# Patient Record
Sex: Female | Born: 1965 | Race: Black or African American | Hispanic: No | Marital: Married | Smoking: Never smoker
Health system: Southern US, Community
[De-identification: ages and names within clinical notes are randomized; demographics above are authoritative.]

## PROBLEM LIST (undated history)

## (undated) DIAGNOSIS — E119 Type 2 diabetes mellitus without complications: Secondary | ICD-10-CM

## (undated) DIAGNOSIS — F419 Anxiety disorder, unspecified: Secondary | ICD-10-CM

## (undated) DIAGNOSIS — F32A Depression, unspecified: Secondary | ICD-10-CM

## (undated) DIAGNOSIS — F329 Major depressive disorder, single episode, unspecified: Secondary | ICD-10-CM

## (undated) DIAGNOSIS — E785 Hyperlipidemia, unspecified: Secondary | ICD-10-CM

## (undated) DIAGNOSIS — I10 Essential (primary) hypertension: Secondary | ICD-10-CM

## (undated) DIAGNOSIS — E559 Vitamin D deficiency, unspecified: Secondary | ICD-10-CM

---

## 2005-04-03 ENCOUNTER — Emergency Department: Payer: Self-pay | Admitting: Unknown Physician Specialty

## 2005-08-13 ENCOUNTER — Ambulatory Visit: Payer: Self-pay

## 2006-09-30 ENCOUNTER — Ambulatory Visit: Payer: Self-pay | Admitting: Internal Medicine

## 2007-05-18 ENCOUNTER — Ambulatory Visit: Payer: Self-pay | Admitting: Internal Medicine

## 2007-05-25 ENCOUNTER — Ambulatory Visit: Payer: Self-pay | Admitting: Internal Medicine

## 2007-07-01 ENCOUNTER — Encounter: Admission: RE | Admit: 2007-07-01 | Discharge: 2007-07-01 | Payer: Self-pay | Admitting: *Deleted

## 2007-12-28 ENCOUNTER — Ambulatory Visit: Payer: Self-pay | Admitting: Internal Medicine

## 2008-02-01 ENCOUNTER — Ambulatory Visit: Payer: Self-pay | Admitting: Internal Medicine

## 2009-02-25 ENCOUNTER — Ambulatory Visit: Payer: Self-pay | Admitting: Internal Medicine

## 2010-10-14 ENCOUNTER — Ambulatory Visit: Payer: Self-pay | Admitting: Internal Medicine

## 2018-02-23 ENCOUNTER — Other Ambulatory Visit: Payer: Self-pay | Admitting: Internal Medicine

## 2018-02-23 ENCOUNTER — Ambulatory Visit
Admission: RE | Admit: 2018-02-23 | Discharge: 2018-02-23 | Disposition: A | Discharge status: Home / Self Care | Payer: 59 | Source: Ambulatory Visit | Attending: Internal Medicine | Admitting: Internal Medicine

## 2018-02-23 DIAGNOSIS — K5900 Constipation, unspecified: Secondary | ICD-10-CM | POA: Diagnosis not present

## 2018-02-23 DIAGNOSIS — R109 Unspecified abdominal pain: Secondary | ICD-10-CM | POA: Diagnosis present

## 2018-02-23 DIAGNOSIS — R160 Hepatomegaly, not elsewhere classified: Secondary | ICD-10-CM | POA: Diagnosis not present

## 2018-02-23 HISTORY — DX: Essential (primary) hypertension: I10

## 2018-02-23 HISTORY — DX: Type 2 diabetes mellitus without complications: E11.9

## 2018-02-23 MED ORDER — IOPAMIDOL (ISOVUE-300) INJECTION 61%
100.0000 mL | Freq: Once | INTRAVENOUS | Status: AC | PRN
  Administered 2018-02-23: 100 mL via INTRAVENOUS

## 2018-02-24 ENCOUNTER — Other Ambulatory Visit: Payer: Self-pay | Admitting: Internal Medicine

## 2018-02-24 DIAGNOSIS — K769 Liver disease, unspecified: Secondary | ICD-10-CM

## 2018-04-06 ENCOUNTER — Ambulatory Visit
Admission: RE | Admit: 2018-04-06 | Payer: Managed Care, Other (non HMO) | Source: Ambulatory Visit | Admitting: Internal Medicine

## 2018-04-06 ENCOUNTER — Encounter: Admission: RE | Payer: Self-pay | Source: Ambulatory Visit

## 2018-04-06 SURGERY — COLONOSCOPY WITH PROPOFOL
Anesthesia: General

## 2018-05-02 ENCOUNTER — Encounter: Payer: Self-pay | Admitting: *Deleted

## 2018-05-03 ENCOUNTER — Encounter: Payer: Self-pay | Admitting: Anesthesiology

## 2018-05-03 ENCOUNTER — Ambulatory Visit
Admission: RE | Admit: 2018-05-03 | Payer: Managed Care, Other (non HMO) | Source: Ambulatory Visit | Admitting: Internal Medicine

## 2018-05-03 ENCOUNTER — Encounter: Admission: RE | Payer: Self-pay | Source: Ambulatory Visit

## 2018-05-03 HISTORY — DX: Hyperlipidemia, unspecified: E78.5

## 2018-05-03 HISTORY — DX: Vitamin D deficiency, unspecified: E55.9

## 2018-05-03 HISTORY — DX: Major depressive disorder, single episode, unspecified: F32.9

## 2018-05-03 HISTORY — DX: Anxiety disorder, unspecified: F41.9

## 2018-05-03 HISTORY — DX: Depression, unspecified: F32.A

## 2018-05-03 SURGERY — COLONOSCOPY WITH PROPOFOL
Anesthesia: General

## 2018-05-03 MED ORDER — FENTANYL CITRATE (PF) 100 MCG/2ML IJ SOLN
INTRAMUSCULAR | Status: AC
  Filled 2018-05-03: qty 2

## 2018-05-03 MED ORDER — PROPOFOL 500 MG/50ML IV EMUL
INTRAVENOUS | Status: AC
  Filled 2018-05-03: qty 50

## 2018-05-03 MED ORDER — MIDAZOLAM HCL 2 MG/2ML IJ SOLN
INTRAMUSCULAR | Status: AC
  Filled 2018-05-03: qty 2

## 2018-08-24 ENCOUNTER — Ambulatory Visit: Admission: RE | Admit: 2018-08-24 | Payer: Managed Care, Other (non HMO) | Source: Ambulatory Visit

## 2018-09-06 ENCOUNTER — Encounter: Admission: RE | Payer: Self-pay | Source: Ambulatory Visit

## 2018-09-06 ENCOUNTER — Ambulatory Visit
Admission: RE | Admit: 2018-09-06 | Payer: Managed Care, Other (non HMO) | Source: Ambulatory Visit | Admitting: Internal Medicine

## 2018-09-06 SURGERY — COLONOSCOPY WITH PROPOFOL
Anesthesia: General

## 2018-12-02 ENCOUNTER — Emergency Department
Admission: EM | Admit: 2018-12-02 | Discharge: 2018-12-02 | Disposition: A | Discharge status: Home / Self Care | Payer: Managed Care, Other (non HMO) | Attending: Emergency Medicine | Admitting: Emergency Medicine

## 2018-12-02 ENCOUNTER — Other Ambulatory Visit: Payer: Self-pay

## 2018-12-02 DIAGNOSIS — I10 Essential (primary) hypertension: Secondary | ICD-10-CM | POA: Diagnosis not present

## 2018-12-02 DIAGNOSIS — E162 Hypoglycemia, unspecified: Secondary | ICD-10-CM

## 2018-12-02 DIAGNOSIS — Z79899 Other long term (current) drug therapy: Secondary | ICD-10-CM | POA: Insufficient documentation

## 2018-12-02 DIAGNOSIS — R61 Generalized hyperhidrosis: Secondary | ICD-10-CM | POA: Diagnosis present

## 2018-12-02 DIAGNOSIS — Z7982 Long term (current) use of aspirin: Secondary | ICD-10-CM | POA: Insufficient documentation

## 2018-12-02 DIAGNOSIS — E119 Type 2 diabetes mellitus without complications: Secondary | ICD-10-CM | POA: Diagnosis not present

## 2018-12-02 DIAGNOSIS — Z794 Long term (current) use of insulin: Secondary | ICD-10-CM | POA: Insufficient documentation

## 2018-12-02 LAB — CBC WITH DIFFERENTIAL/PLATELET
Abs Immature Granulocytes: 0.02 10*3/uL (ref 0.00–0.07)
Basophils Absolute: 0 10*3/uL (ref 0.0–0.1)
Basophils Relative: 0 %
EOS ABS: 0.3 10*3/uL (ref 0.0–0.5)
Eosinophils Relative: 3 %
HCT: 41.5 % (ref 36.0–46.0)
Hemoglobin: 13.4 g/dL (ref 12.0–15.0)
IMMATURE GRANULOCYTES: 0 %
Lymphocytes Relative: 51 %
Lymphs Abs: 5 10*3/uL — ABNORMAL HIGH (ref 0.7–4.0)
MCH: 26.4 pg (ref 26.0–34.0)
MCHC: 32.3 g/dL (ref 30.0–36.0)
MCV: 81.9 fL (ref 80.0–100.0)
Monocytes Absolute: 0.8 10*3/uL (ref 0.1–1.0)
Monocytes Relative: 8 %
NEUTROS PCT: 38 %
Neutro Abs: 3.8 10*3/uL (ref 1.7–7.7)
Platelets: 229 10*3/uL (ref 150–400)
RBC: 5.07 MIL/uL (ref 3.87–5.11)
RDW: 12.8 % (ref 11.5–15.5)
WBC: 9.9 10*3/uL (ref 4.0–10.5)
nRBC: 0 % (ref 0.0–0.2)

## 2018-12-02 LAB — COMPREHENSIVE METABOLIC PANEL
ALT: 21 U/L (ref 0–44)
AST: 30 U/L (ref 15–41)
Albumin: 3.8 g/dL (ref 3.5–5.0)
Alkaline Phosphatase: 103 U/L (ref 38–126)
Anion gap: 10 (ref 5–15)
BUN: 16 mg/dL (ref 6–20)
CO2: 25 mmol/L (ref 22–32)
Calcium: 9 mg/dL (ref 8.9–10.3)
Chloride: 108 mmol/L (ref 98–111)
Creatinine, Ser: 0.63 mg/dL (ref 0.44–1.00)
GFR calc Af Amer: >60 mL/min (ref >60)
GFR calc non Af Amer: >60 mL/min (ref >60)
Glucose, Bld: 34 mg/dL — CL (ref 70–99)
Potassium: 3.3 mmol/L — ABNORMAL LOW (ref 3.5–5.1)
Sodium: 143 mmol/L (ref 135–145)
Total Bilirubin: 0.7 mg/dL (ref 0.3–1.2)
Total Protein: 8.1 g/dL (ref 6.5–8.1)

## 2018-12-02 LAB — GLUCOSE, CAPILLARY
Glucose-Capillary: 18 mg/dL — CL (ref 70–99)
Glucose-Capillary: 171 mg/dL — ABNORMAL HIGH (ref 70–99)
Glucose-Capillary: 183 mg/dL — ABNORMAL HIGH (ref 70–99)
Glucose-Capillary: 192 mg/dL — ABNORMAL HIGH (ref 70–99)

## 2018-12-02 MED ORDER — DEXTROSE 50 % IV SOLN
INTRAVENOUS | Status: AC
  Filled 2018-12-02: qty 50

## 2018-12-02 MED ORDER — DEXTROSE 50 % IV SOLN
1.0000 | Freq: Once | INTRAVENOUS | Status: AC
  Administered 2018-12-02: 50 mL via INTRAVENOUS

## 2018-12-02 NOTE — Discharge Instructions (Addendum)
Continue to take your insulin as prescribed.  Be sure to check your sugar frequently over the next several days.  Return to the ER for new or worsening hypoglycemia, weakness or lightheadedness, vomiting, or any other new or worsening symptoms that concern you.

## 2018-12-02 NOTE — ED Triage Notes (Signed)
Patient's family reports patient with typical signs of low blood sugar (did not check at home) patient was sweaty and not wanting to talk.  Patient was given juice at home.

## 2018-12-02 NOTE — ED Notes (Signed)
Pt. Going home with husband. 

## 2018-12-02 NOTE — ED Provider Notes (Signed)
[facility] Emergency Department Provider Note ____________________________________________   First MD Initiated Contact with Patient 12/02/18 1922     (approximate)  I have reviewed the triage vital signs and the nursing notes.   HISTORY  Chief Complaint Hypoglycemia    HPI Alicia Acosta is a 53 y.o. female with PMH as noted below including diabetes on insulin who presents with apparent hypoglycemia, acute onset just prior to arrival, and characterized by being sweaty and having difficulty talking.  Patient did not improve with juice at home.  The patient states that she took her Humalog at approximately 6:30 PM thinking that she was about to eat, but then did not eat right away.  She also has not monitored her sugar at least several days.  After getting D50, the patient now denies any symptoms.  She denies any recent illness.   Past Medical History:  Diagnosis Date  . Anxiety   . Depression   . Diabetes mellitus without complication (HCC)   . Hyperlipidemia   . Hypertension   . Vitamin D deficiency     There are no active problems to display for this patient.   No past surgical history on file.  Prior to Admission medications   Medication Sig Start Date End Date Taking? Authorizing Provider  aspirin EC 81 MG tablet Take 81 mg by mouth daily.   Yes [provider]  clotrimazole-betamethasone (LOTRISONE) cream Apply 1 application topically 2 (two) times daily.   Yes [provider]  DULoxetine (CYMBALTA) 60 MG capsule Take 120 mg by mouth daily. 09/08/18  Yes [provider]  gabapentin (NEURONTIN) 100 MG capsule Take 100 mg by mouth 3 (three) times daily.   Yes [provider]  Glatiramer Acetate (COPAXONE) 40 MG/ML SOSY Inject 40 mg into the skin 3 (three) times a week.   Yes [provider]  insulin glargine (LANTUS) 100 UNIT/ML injection Inject 30 Units into the skin daily.    Yes [provider]  insulin lispro (HUMALOG) 100 UNIT/ML injection Inject 15 Units into the skin 3 (three) times daily before meals.    Yes [provider]  losartan-hydrochlorothiazide (HYZAAR) 100-12.5 MG tablet Take 1 tablet by mouth daily.   Yes [provider]  Multiple Vitamin (MULTIVITAMIN) tablet Take 1 tablet by mouth daily.   Yes [provider]  rosuvastatin (CRESTOR) 20 MG tablet Take 20 mg by mouth daily. 06/10/18 06/10/19 Yes [provider]  traZODone (DESYREL) 100 MG tablet Take 100 mg by mouth at bedtime.   Yes [provider]    Allergies Sulfa antibiotics  No family history on file.  Social History Social History   Tobacco Use  . Smoking status: Never Smoker  . Smokeless tobacco: Never Used  Substance Use Topics  . Alcohol use: Yes  . Drug use: Never    Review of Systems  Constitutional: No fever. Eyes: No redness. ENT: No sore throat. Cardiovascular: Denies chest pain. Respiratory: Denies shortness of breath. Gastrointestinal: No vomiting or diarrhea.  Genitourinary: Negative for dysuria.  Positive for frequency. Musculoskeletal: Negative for back pain. Skin: Negative for rash. Neurological: Negative for headache.   ____________________________________________   PHYSICAL EXAM:  VITAL SIGNS: ED Triage Vitals [12/02/18 1912]  Enc Vitals Group     BP (!) 164/74     Pulse Rate (!) 107     Resp 20     Temp      Temp src  SpO2 99 %     Weight 190 lb (86.2 kg)     Height 5\' 6"  (1.676 m)     Head Circumference      Peak Flow      Pain Score 0     Pain Loc      Pain Edu?      Excl. in GC?     Constitutional: Alert and oriented. Well appearing and in no acute distress. Eyes: Conjunctivae are normal.  Head: Atraumatic. Nose: No congestion/rhinnorhea. Mouth/Throat: Mucous membranes are moist.   Neck: Normal range of motion.  Cardiovascular: Normal rate, regular rhythm. Grossly normal heart sounds.   Good peripheral circulation. Respiratory: Normal respiratory effort.  No retractions. Lungs CTAB. Gastrointestinal: No distention.  Musculoskeletal: Extremities warm and well perfused.  Neurologic:  Normal speech and language. No gross focal neurologic deficits are appreciated.  Skin:  Skin is warm and dry. No rash noted. Psychiatric: Mood and affect are normal. Speech and behavior are normal.  ____________________________________________   LABS (all labs ordered are listed, but only abnormal results are displayed)  Labs Reviewed  GLUCOSE, CAPILLARY - Abnormal; Notable for the following components:      Result Value   Glucose-Capillary 18 (*)    All other components within normal limits  COMPREHENSIVE METABOLIC PANEL - Abnormal; Notable for the following components:   Potassium 3.3 (*)    Glucose, Bld 34 (*)    All other components within normal limits  CBC WITH DIFFERENTIAL/PLATELET - Abnormal; Notable for the following components:   Lymphs Abs 5.0 (*)    All other components within normal limits  GLUCOSE, CAPILLARY - Abnormal; Notable for the following components:   Glucose-Capillary 183 (*)    All other components within normal limits  GLUCOSE, CAPILLARY - Abnormal; Notable for the following components:   Glucose-Capillary 171 (*)    All other components within normal limits   ____________________________________________  EKG   ____________________________________________  RADIOLOGY    ____________________________________________   PROCEDURES  Procedure(s) performed: No  Procedures  Critical Care performed: No ____________________________________________   INITIAL IMPRESSION / ASSESSMENT AND PLAN / ED COURSE  Pertinent labs & imaging results that were available during my care of the patient were reviewed by me and considered in my medical decision making (see chart for details).  53 year old female with PMH as noted above including diabetes on Humalog  and Lantus presents with apparent hypoglycemia shortly after taking her Humalog around 6:30 PM and then not eating.  On arrival to the ED the patient was altered and diaphoretic.  Her glucose was noted to be 18 and she was given D50.  She now is back at her baseline mental status and is asymptomatic.  The remainder of the exam is unremarkable.  The vital signs are stable.  Presentation is consistent with hyperglycemia related to the short acting insulin.  We will observe her for several hours, repeat glucose and I anticipate discharge home if she remains stable.  ----------------------------------------- 8:28 PM on 12/02/2018 -----------------------------------------  The patient continues to remain alert with stable blood glucose.  She was able to eat and is tolerating p.o. without difficulty.  She very much would like to go home.  She has been in the ED for approximately 90 minutes at this time.  I advised that I ordinarily would prefer to watch her a little bit longer, however since she is going home with a family member and will be observed for the next few hours I think it  is reasonable to send her home now.  She is stable for discharge at this time.  Return precautions given, and she expressed understanding.  ____________________________________________   FINAL CLINICAL IMPRESSION(S) / ED DIAGNOSES  Final diagnoses:  Hypoglycemia      NEW MEDICATIONS STARTED DURING THIS VISIT:  New Prescriptions   No medications on file     Note:  This document was prepared using Dragon voice recognition software and may include unintentional dictation errors.    Dionne Bucy, MD 12/02/18 2029

## 2019-05-20 ENCOUNTER — Other Ambulatory Visit: Payer: Self-pay

## 2019-05-20 DIAGNOSIS — Z20822 Contact with and (suspected) exposure to covid-19: Secondary | ICD-10-CM

## 2019-05-21 LAB — NOVEL CORONAVIRUS, NAA: SARS-CoV-2, NAA: NOT DETECTED

## 2019-05-23 ENCOUNTER — Telehealth: Payer: Self-pay | Admitting: General Practice

## 2019-05-23 NOTE — Telephone Encounter (Signed)
Pt aware covid lab test negative, not detected °

## 2019-07-24 ENCOUNTER — Other Ambulatory Visit: Payer: Self-pay | Admitting: Infectious Diseases

## 2019-07-24 ENCOUNTER — Other Ambulatory Visit: Payer: Self-pay

## 2019-07-24 ENCOUNTER — Ambulatory Visit
Admission: RE | Admit: 2019-07-24 | Discharge: 2019-07-24 | Disposition: A | Discharge status: Home / Self Care | Payer: 59 | Source: Ambulatory Visit | Attending: Infectious Diseases | Admitting: Infectious Diseases

## 2019-07-24 DIAGNOSIS — M25511 Pain in right shoulder: Secondary | ICD-10-CM

## 2019-07-24 DIAGNOSIS — M7989 Other specified soft tissue disorders: Secondary | ICD-10-CM

## 2019-12-22 ENCOUNTER — Encounter: Payer: 59 | Attending: "Endocrinology | Admitting: Dietician

## 2019-12-22 ENCOUNTER — Other Ambulatory Visit: Payer: Self-pay

## 2019-12-22 DIAGNOSIS — Z713 Dietary counseling and surveillance: Secondary | ICD-10-CM | POA: Diagnosis present

## 2019-12-22 DIAGNOSIS — E1042 Type 1 diabetes mellitus with diabetic polyneuropathy: Secondary | ICD-10-CM | POA: Insufficient documentation

## 2019-12-22 NOTE — Patient Instructions (Signed)
   Plan to eat something every 4-5 hours during the day. Include some protein and some carb with each meal.   If you want to include orange juice, try [place] 50, and keep to 4-6 ounce portion.  If not hungry, try at least a small amount such as a few crackers with peanut butter or cheese  Try cool, fresh meals like salads, chicken or tuna salad with crackers, low fat cheese and crackers or fruit, small amount of pudding with graham crackers and peanut butter  Include a snack with some carb and protein before going to sleep especially if up more than 3 hours after dinner.   Remember that stress, pain, sleep (inadequate sleep) can all affect blood sugar.

## 2019-12-22 NOTE — Progress Notes (Signed)
Medical Nutrition Therapy: Visit start time: 3818  end time: 1500  Assessment:  Diagnosis: Type 1 diabetes Past medical history: MS, HTN, hyperlipidemia, sleep apnea Psychosocial issues/ stress concerns: high stress level/ depression due to health, pandemic. Under care of psychiatrist.  Preferred learning method:  . Hands-on   Current weight: 191.6lbs Height: 5'6" Medications, supplements: reconciled list in medical record. DM meds = Lantus 30 units daily, Humalog 15 units before meals  Progress and evaluation:   Patient reports fluctuating BGs with some low BGs and some highs. Drinks simply lemonade if BG is low. Recent episode of 46 in early am 3/18, drank juice and then was 139. She reports some high BGs after taking hypoglycemia treatment.   She was seen at this office several years ago for carb counting but has not been able to implement effectively.   She reports often lack of hunger and then doesn't eat or eats very little.Has been reducing intake of diet soda, limiting to 12oz qam and 12oz qpm.  High stress level might also be affecting BG control per patient including pandemic, helping mother and mother-in-law. Limiting shopping except for lysol spray.    She takes care of granddaughter with special needs Mon - Thursdays.  Physical activity: walking in spring, summer, fall, not in winter. Plans to resume soon.  Dietary Intake:  Usual eating pattern includes 1-3 meals and 0-1 snacks per day. Dining out frequency: 3-6 meals per week.  Breakfast: cooks most days usu bacon and eggs but often does not eat; occ oatmeal with fruit but does not enjoy it; 1 piece toast; today 1/2 pc bacon only Snack: none Lunch: often skips; sometimes 2pm -- quick meal -- leftovers; cajun [place] sandwich and pimento cheese; chicken wings; [employer] chicken tenders (ate 1), + 1 portion pudding; frozen meal ie chicken alfredo/ meat loaf with mac and cheese; crackers with peanut butter; chick fila kale  salad or grilled chicken sandwich Snack: none Supper: 7-8pm -- bbq chicken + rice + broccoli; crock pot chicken with rice/ noodles/ potatoes + mixed veg/ brocc, cabbage --variety; occ chick fila sandwich; usually baked foods; steak once a week with baked potato and cabbage occ just steak; chicken casserole with brocc and cheese; ground beef Snack: none Beverages: water, diet soda  Nutrition Care Education: Topics covered:  Basic nutrition: basic food groups, appropriate nutrient balance, appropriate meal and snack schedule, general nutrition guidelines    Weight control: estimated energy needs at minimum of 1200 kcal, provided guidance for 45% CHO, 25% pro, 30% fat using plate method and food lists. Advanced nutrition:  food label reading for portion size, carb content Diabetes: appropriate meal and snack schedule, appropriate carb intake and balance, role of fiber, protein, fat; basics of carb counting for mealtime insulin dosing; food and non-food causes of high and low BGs, importance of eating at regular intervals and consistent carb intake to help stabilize BGs   Nutritional Diagnosis:  [place]-2.2 Altered nutrition-related laboratory As related to Type 1 diabetes.  As evidenced by patient with recent HbA1C of 10.6%. [id number] Predicted suboptimal nutrient intake As related to poor appetite and skipped meals.  As evidenced by patient report of dietary history.  Intervention:   Instruction and discussion as noted above.  Poor appetite/ lack of hunger have led to infrequent and inconsistent eating pattern. Discussion today focused on improving balance and consistency of meals/ snacks.   Will work further on carb counting accuracy and possible pump therapy and next visit.  Provided written nutrition  goals with input from patient.  Education Materials given:  . Plate Planner with food lists, sample meal pattern . Sample menus . Snacking handout . Goals/ instructions   Learner/ who was  taught:  . Patient    Level of understanding: Marland Kitchen Verbalizes/ demonstrates competency   Demonstrated degree of understanding via:   Teach back Learning barriers: . None  Willingness to learn/ readiness for change: . Eager, change in progress   Monitoring and Evaluation:  Dietary intake, exercise, BG control, and body weight      follow up: 01/26/20 at 11:00am

## 2020-01-26 ENCOUNTER — Ambulatory Visit: Payer: 59 | Admitting: Dietician

## 2020-02-09 ENCOUNTER — Ambulatory Visit: Payer: 59 | Admitting: Dietician

## 2020-03-15 ENCOUNTER — Other Ambulatory Visit: Payer: Self-pay | Admitting: Family Medicine

## 2020-03-15 ENCOUNTER — Other Ambulatory Visit: Payer: Self-pay

## 2020-03-15 ENCOUNTER — Ambulatory Visit
Admission: RE | Admit: 2020-03-15 | Discharge: 2020-03-15 | Disposition: A | Discharge status: Home / Self Care | Payer: 59 | Source: Ambulatory Visit | Attending: Family Medicine | Admitting: Family Medicine

## 2020-03-15 DIAGNOSIS — M79672 Pain in left foot: Secondary | ICD-10-CM

## 2020-03-15 DIAGNOSIS — M7989 Other specified soft tissue disorders: Secondary | ICD-10-CM | POA: Diagnosis present

## 2020-03-20 ENCOUNTER — Encounter: Payer: Self-pay | Admitting: Dietician

## 2020-03-20 NOTE — Progress Notes (Signed)
Have not heard back from patient to reschedule her cancelled appointment from 02/09/20, which was rescheduled from 01/26/20. Sent notification to referring provider.

## 2020-04-09 ENCOUNTER — Ambulatory Visit: Payer: 59

## 2020-04-09 ENCOUNTER — Other Ambulatory Visit (HOSPITAL_COMMUNITY): Payer: Self-pay | Admitting: Physician Assistant

## 2020-04-09 ENCOUNTER — Other Ambulatory Visit: Payer: Self-pay | Admitting: Physician Assistant

## 2020-04-09 ENCOUNTER — Ambulatory Visit
Admission: RE | Admit: 2020-04-09 | Discharge: 2020-04-09 | Disposition: A | Discharge status: Home / Self Care | Payer: 59 | Source: Ambulatory Visit | Attending: Physician Assistant | Admitting: Physician Assistant

## 2020-04-09 ENCOUNTER — Other Ambulatory Visit: Payer: Self-pay

## 2020-04-09 DIAGNOSIS — R1012 Left upper quadrant pain: Secondary | ICD-10-CM | POA: Insufficient documentation

## 2020-07-09 ENCOUNTER — Encounter: Payer: Self-pay | Admitting: Physical Therapy

## 2020-07-09 ENCOUNTER — Other Ambulatory Visit: Payer: Self-pay

## 2020-07-09 ENCOUNTER — Ambulatory Visit: Payer: 59 | Attending: Neurology | Admitting: Physical Therapy

## 2020-07-09 DIAGNOSIS — R296 Repeated falls: Secondary | ICD-10-CM | POA: Diagnosis present

## 2020-07-09 DIAGNOSIS — R2689 Other abnormalities of gait and mobility: Secondary | ICD-10-CM | POA: Insufficient documentation

## 2020-07-09 DIAGNOSIS — M6281 Muscle weakness (generalized): Secondary | ICD-10-CM | POA: Insufficient documentation

## 2020-07-09 NOTE — Therapy (Signed)
[facility] PHYSICAL AND SPORTS MEDICINE [address], [place], [address] Phone: [phone]   Fax:  [phone]  Physical Therapy Evaluation  Patient Details  Name: Alicia Acosta MRN: [id number] Date of Birth: Apr 22, 1966 No data recorded  Encounter Date: 07/09/2020   PT End of Session - 07/09/20 1138    Visit Number 1    Number of Visits 17    Date for PT Re-Evaluation 09/06/20    PT Start Time 1030    PT Stop Time 1130    PT Time Calculation (min) 60 min    Equipment Utilized During Treatment Gait belt    Activity Tolerance Patient tolerated treatment well    Behavior During Therapy [facility] for tasks assessed/performed           Past Medical History:  Diagnosis Date  . Anxiety   . Depression   . Diabetes mellitus without complication (HCC)   . Hyperlipidemia   . Hypertension   . Vitamin D deficiency     History reviewed. No pertinent surgical history.  There were no vitals filed for this visit.    Subjective Assessment - 07/09/20 1039    Pertinent History Patient is a 54 year old female presenting with weakness, and balance deficits related to MS diagnosis. 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 Her last flare up was Jan 2021, and her balance and fatigue has been worse since causing her to seek out PT. Endorses multiple falls in the past 6 months (at least 5), but does report 3 of them were during a medication transition. Reports she has fallen by "missing a step" going down steps, reaching for water hose on the gross, and mostly falling when changing surfaces. After her last fall she suffered a fracture in her R 3rd toe, without pain. She is a diabetic, and reports she has full sensation in hands and feet, but that they do tingle often. Reports she has had increased general fatigue following Jan 2021 flare up d/t  decreased sleep health (reports she stays up until 3am), and activity induced fatigue, tiring fast with going up flight of stairs, walking > . Patient is on disability currently, but enjoys traveling with her friends/family (has aspirations to go to [place] in March 2022), she does walk around her neighborhood 3x/week for exercise (about ). 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 Pt denies N/V, B&B changes, unexplained weight fluctuation, saddle paresthesia, fever, night sweats, or unrelenting night pain at this time.    Limitations House hold activities;Standing;Walking;Lifting    How long can you sit comfortably? unlimited    How long can you stand comfortably?    How long can you walk comfortably?    Diagnostic tests MRI 05/18/20 Findings compatible with demyelinating disease within the brain. There  is a new 7 mm right periventricular lesion exhibiting mild diffusion restriction suggesting active demyelination. No abnormal T2 hyperintense lesions of the cervical cord.    Patient Stated Goals Increase endurance to usher at church, be able to dance, and increase walking distance.    Currently in Pain? No/denies                 OBJECTIVE  MUSCULOSKELETAL: Tremor: Absent Bulk: Normal Tone:  Normal, no clonus  Posture Lower crossed bilat ankle eversion with mild bilat knee valgus in standing  Gait No gross abnormalities in gait noted  Strength R/L 5/5 Hip flexion 5/5 Hip external rotation 5/5 Hip internal rotation 3-/3- Hip extension  3+/4- Hip abduction 4/4 Hip adduction 5/5 Knee extension 4/4 Knee flexion 3+/3+ Ankle Dorsiflexion Unable to complete d/t toe fx/2 raises Ankle Plantarflexion  NEUROLOGICAL:  Mental Status Patient is oriented to person, place and time.  Recent memory is intact.  Remote  memory is intact.  Attention span and concentration are intact.  Expressive speech is intact.  Patient's fund of knowledge is within normal limits for educational level.  Cranial Nerves Visual acuity and visual fields are intact  Extraocular muscles are intact  Facial sensation is intact bilaterally  Facial strength is intact bilaterally  Hearing is normal as tested by gross conversation Palate elevates midline, normal phonation  Shoulder shrug strength is intact  Tongue protrudes midline   Sensation Grossly intact to light touch bilateral UEs/LEs as determined by testing dermatomes C2-T2/L2-S2 respectively Proprioception and hot/cold testing deferred on this date  Reflexes R/L /2+ Knee Jerk (L3/4) deminished Ankle Jerk (S1/2)  Coordination/Cerebellar Finger to Nose: WNL Heel to Shin: WNL Rapid alternating movements: WNL Finger Opposition: WNL Pronator Drift: Negative   FUNCTIONAL OUTCOME MEASURES   Results Comments  DGI 18/24   FGA 20/30   SLS RLE: 2sec LLE: 2.5sec   5TSTS 26seconds   6 Minute Walk Test deferred   10 Meter Gait Speed Self-selected: s = 0.6m/s; Fastest: s = 1.20m/s Below normative values for full community ambulation    POSTURAL CONTROL TESTS   Modified Clinical Test of Sensory Interaction for Balance    (CTSIB):  CONDITION TIME STRATEGY SWAY  Eyes open, firm surface 30 seconds ankle none  Eyes closed, firm surface 30 seconds ankle minimal  Eyes open, foam surface 30 seconds ankle minimal  Eyes closed, foam surface 30 seconds ankle minimal    Ther-Ex PT reviewed the following HEP with patient with patient able to demonstrate a set of the following with min cuing for correction needed. PT educated patient on parameters of therex (how/when to inc/decrease intensity, frequency, rep/set range, stretch hold time, and purpose of therex) with verbalized understanding.  Access Code: [email or web address] Supine Bridge - 1 x daily - 7 x weekly - 3 sets - 5-10  reps Sit to Stand without Arm Support - 1 x daily - 7 x weekly - 3 sets - 5-10 reps Standing Single Leg Stance with Counter Support - 1 x daily - 7 x weekly - 3 sets - 15-30sec hold                        Objective measurements completed on examination: See above findings.               PT Education - 07/09/20 1100    Education Details Patient was educated on diagnosis, anatomy and pathology involved, prognosis, role of PT, and was given an HEP, demonstrating exercise with proper form following verbal and tactile cues, and was given a paper hand out to continue exercise at home. Pt was educated on and agreed to plan of care.    Person(s) Educated Patient    Methods Explanation;Demonstration;Tactile cues;Verbal cues    Comprehension Verbalized understanding;Returned demonstration;Verbal cues required;Tactile cues required            PT Short Term Goals - 07/09/20 1143  PT SHORT TERM GOAL #1   Title Pt will be independent with HEP in order to improve strength and balance in order to decrease fall risk and improve function at home and work.    Baseline 07/09/20 HEP given    Time 4    Period Weeks    Status New      PT SHORT TERM GOAL #2   Title Pt will decrease 5TSTS by at least 3 seconds in order to demonstrate clinically significant improvement in LE strength.    Baseline 07/09/20 26sec             PT Long Term Goals - 07/09/20 1241      PT LONG TERM GOAL #1   Title Patient will increase FOTO score to 62 to demonstrate predicted increase in functional mobility to complete ADLs    Baseline 07/09/20 57    Time 8    Period Weeks    Status New      PT LONG TERM GOAL #2   Title Pt will decrease 5TSTS by at least 10seconds in order to demonstrate normative BLE strength    Baseline 07/09/20 26sec    Time 8    Period Weeks    Status New      PT LONG TERM GOAL #3   Title Patient will demonstrate an increase in FGA score of at least 4 points to  demonstrate clinically significant increase in dynamic balance    Baseline 07/09/20 20/30    Time 8    Period Weeks    Status New                  Plan - 07/09/20 1258    Clinical Impression Statement Pt is a 54 year old female presenting with balance deficits/frequent falls and increased fatigue following MS flare up Jan 2021 of relapsing remittenting MS. Impairments in decreased endurance, decreased LE and core strength, pain, decreased sensation, and decreased dynamic balance. Activity impairments in walking >9115mins, stair ambulation, yard work, and lifting; inhibiting full participation in ADLs and community ambulation. Pt will benefit from skilled PT to address impairments to return to optimal PLOF    Personal Factors and Comorbidities Comorbidity 1;Comorbidity 2;Comorbidity 3+;Fitness;Past/Current Experience    Comorbidities HTN, DM, MS    Examination-Activity Limitations Squat;Lift;Stand;Locomotion Level    Examination-Participation Restrictions Psychiatric nurseCommunity Activity;Cleaning;Laundry;Yard Work    Conservation officer, historic buildingstability/Clinical Decision Making Evolving/Moderate complexity    Clinical Decision Making Moderate    Rehab Potential Good    PT Frequency 2x / week    PT Duration 8 weeks    PT Treatment/Interventions ADLs/Self Care Home Management;Cryotherapy;Moist Heat;Electrical Stimulation;DME Instruction;Traction;Ultrasound;Stair training;Gait training;Therapeutic activities;Therapeutic exercise;Neuromuscular re-education;Passive range of motion;Dry needling;Manual techniques;Iontophoresis 4mg /ml Dexamethasone;Functional mobility training;Balance training;Patient/family education;Spinal Manipulations;Joint Manipulations;Energy conservation    PT Next Visit Plan 6MWT, HEP review    PT Home Exercise Plan bridge, STS, SLS    Consulted and Agree with Plan of Care Patient           Patient will benefit from skilled therapeutic intervention in order to improve the following deficits and impairments:   Abnormal gait, Decreased balance, Decreased endurance, Decreased mobility, Difficulty walking, Decreased activity tolerance, Decreased coordination, Decreased strength, Increased fascial restricitons, Impaired flexibility, Impaired UE functional use, Postural dysfunction, Pain, Decreased range of motion, Improper body mechanics, Impaired sensation  Visit Diagnosis: Muscle weakness (generalized)  Other abnormalities of gait and mobility  Repeated falls     Problem List There are no problems to display for this patient.  Hilda Lias DPT Hilda Lias 07/09/2020, 2:55 PM  [facility] PHYSICAL AND SPORTS MEDICINE [address], [place], [address] Phone: [phone]   Fax:  [phone]  Name: Alicia Acosta MRN: [id number] Date of Birth: 1965-12-15

## 2020-07-11 ENCOUNTER — Encounter: Payer: 59 | Admitting: Physical Therapy

## 2020-07-12 ENCOUNTER — Ambulatory Visit: Payer: 59 | Admitting: Physical Therapy

## 2020-07-16 ENCOUNTER — Other Ambulatory Visit: Payer: Self-pay

## 2020-07-16 ENCOUNTER — Ambulatory Visit: Payer: 59 | Admitting: Physical Therapy

## 2020-07-16 ENCOUNTER — Encounter: Payer: Self-pay | Admitting: Physical Therapy

## 2020-07-16 DIAGNOSIS — M6281 Muscle weakness (generalized): Secondary | ICD-10-CM

## 2020-07-16 DIAGNOSIS — R2689 Other abnormalities of gait and mobility: Secondary | ICD-10-CM

## 2020-07-16 DIAGNOSIS — R296 Repeated falls: Secondary | ICD-10-CM

## 2020-07-16 NOTE — Therapy (Signed)
[place] [facility] PH[facility] AND [facility] MEDICINE [address] [place], [address] Phone: [phone]   Fax:  [phone]  Physical Therapy Treatment  Patient Details  Name: Alicia Acosta MRN: [id number] Date of Birth: [birth year] No data recorded  Encounter Date: 07/16/2020   PT End of Session - 07/16/20 1400    Visit Number 2    Number of Visits 17    Date for PT Re-Evaluation 09/06/20    PT Start Time 0155    PT Stop Time 0233    PT Time Calculation (min) 38 min    Activity Tolerance Patient tolerated treatment well    Behavior During Therapy [facility] for tasks assessed/performed           Past Medical History:  Diagnosis Date  . Anxiety   . Depression   . Diabetes mellitus without complication (HCC)   . Hyperlipidemia   . Hypertension   . Vitamin D deficiency     History reviewed. No pertinent surgical history.  There were no vitals filed for this visit.   Gait Training  1221ft with review of mechanics and encouragement throughout, education on energy conservation with verbalized understanding. Patient reports fatigue followig  at start: 1.29m/s  At final lap: 0.7m/s    Ther-Ex Bridge 2x12 initially patient reports she feels this in her low back, following glute set x10 is able to complete with glute contraction  STS from elevated surface 2x 12 with cuing for light tap and full hip ext with stand with good carry over Sidestepping 51ft 3x R and L with min cuing for foot clearance and large steps with good carry over Alt lateral lunges 2x 12 with good carry over of demo and cuing for technique                       PT Education - 07/16/20 1400    Education Details HEP review, therex form/technique    Person(s) Educated Patient    Methods Explanation;Demonstration;Verbal cues    Comprehension Verbalized understanding;Returned demonstration;Verbal cues required            PT Short Term  Goals - 07/09/20 1143      PT SHORT TERM GOAL #1   Title Pt will be independent with HEP in order to improve strength and balance in order to decrease fall risk and improve function at home and work.    Baseline 07/09/20 HEP given    Time 4    Period Weeks    Status New      PT SHORT TERM GOAL #2   Title Pt will decrease 5TSTS by at least 3 seconds in order to demonstrate clinically significant improvement in LE strength.    Baseline 07/09/20 26sec             PT Long Term Goals - 07/16/20 1436      PT LONG TERM GOAL #1   Title Patient will increase FOTO score to 62 to demonstrate predicted increase in functional mobility to complete ADLs    Baseline 07/09/20 57    Time 8    Period Weeks    Status New      PT LONG TERM GOAL #2   Title Pt will decrease 5TSTS by at least 10seconds in order to demonstrate normative BLE strength    Baseline 07/09/20 26sec    Time 8    Period Weeks    Status New  PT LONG TERM GOAL #3   Title Patient will demonstrate an increase in FGA score of at least 4 points to demonstrate clinically significant increase in dynamic balance    Baseline 07/09/20 20/30    Time 8    Period Weeks    Status New      PT LONG TERM GOAL #4   Title Pt will decrease walk speed differential from first 37m and last 48m of to at least 0.44m/s to demonstrate increased muscular and cardiovascular endurance over community distance    Baseline 07/16/20 0.6m/s (start: 1.2m/s; end 0.30m/s)    Time 8    Period Weeks    Status New                 Plan - 07/16/20 1409    Clinical Impression Statement PT completed walk test, where patient is able to demonstrate good community distance, but decreased endurance over as evidence in decrease in gait speed by 0.40m/s from start to end of test. Patient verbalizes understanding of findings and need for endurance based therex and increased walking program. Patient is able to comply with all multimodal cuing  for proper technique of therex with good motivation and no increased pain throughout session. PT will continue progression as able.    Personal Factors and Comorbidities Comorbidity 1;Comorbidity 2;Comorbidity 3+;Fitness;Past/Current Experience    Comorbidities HTN, DM, MS    Examination-Activity Limitations Squat;Lift;Stand;Locomotion Level    Examination-Participation Restrictions Psychiatric nurse;Yard Work    Conservation officer, historic buildings Evolving/Moderate complexity    Clinical Decision Making Moderate    Rehab Potential Good    PT Frequency 2x / week    PT Duration 8 weeks    PT Treatment/Interventions ADLs/Self Care Home Management;Cryotherapy;Moist Heat;Electrical Stimulation;DME Instruction;Traction;Ultrasound;Stair training;Gait training;Therapeutic activities;Therapeutic exercise;Neuromuscular re-education;Passive range of motion;Dry needling;Manual techniques;Iontophoresis 4mg /ml Dexamethasone;Functional mobility training;Balance training;Patient/family education;Spinal Manipulations;Joint Manipulations;Energy conservation    PT Next Visit Plan , HEP review    PT Home Exercise Plan bridge, STS, SLS           Patient will benefit from skilled therapeutic intervention in order to improve the following deficits and impairments:  Abnormal gait, Decreased balance, Decreased endurance, Decreased mobility, Difficulty walking, Decreased activity tolerance, Decreased coordination, Decreased strength, Increased fascial restricitons, Impaired flexibility, Impaired UE functional use, Postural dysfunction, Pain, Decreased range of motion, Improper body mechanics, Impaired sensation  Visit Diagnosis: Muscle weakness (generalized)  Other abnormalities of gait and mobility  Repeated falls     Problem List There are no problems to display for this patient.  DPT Hilda Lias 07/16/2020, 2:38 PM  [place] [facility] PHYSICAL AND SPORTS MEDICINE [address], [place] Phone: [phone]   Fax:  [phone]  Name: Alicia Acosta MRN: Ezequiel Kayser Date of Birth: 03-03-1966

## 2020-07-18 ENCOUNTER — Ambulatory Visit: Payer: 59 | Admitting: Physical Therapy

## 2020-07-23 ENCOUNTER — Ambulatory Visit: Payer: 59 | Admitting: Physical Therapy

## 2020-07-23 ENCOUNTER — Other Ambulatory Visit: Payer: Self-pay

## 2020-07-23 ENCOUNTER — Encounter: Payer: Self-pay | Admitting: Physical Therapy

## 2020-07-23 DIAGNOSIS — M6281 Muscle weakness (generalized): Secondary | ICD-10-CM | POA: Diagnosis not present

## 2020-07-23 DIAGNOSIS — R2689 Other abnormalities of gait and mobility: Secondary | ICD-10-CM

## 2020-07-23 DIAGNOSIS — R296 Repeated falls: Secondary | ICD-10-CM

## 2020-07-23 NOTE — Therapy (Signed)
[place] [facility] [address], [place], [address] Phone: [phone]   Fax:  [phone]  Physical Therapy Treatment  Patient Details  Name: Alicia Acosta MRN: [id number] Date of Birth: 05-03-1966 No data recorded  Encounter Date: 07/23/2020   PT End of Session - 07/23/20 1128    Visit Number 3    Number of Visits 17    Date for PT Re-Evaluation 09/06/20    PT Start Time 1119    PT Stop Time 1200    PT Time Calculation (min) 41 min    Activity Tolerance Patient tolerated treatment well    Behavior During Therapy [facility] for tasks assessed/performed           Past Medical History:  Diagnosis Date  . Anxiety   . Depression   . Diabetes mellitus without complication (HCC)   . Hyperlipidemia   . Hypertension   . Vitamin D deficiency     History reviewed. No pertinent surgical history.  There were no vitals filed for this visit.   Subjective Assessment - 07/23/20 1125    Subjective Patient was starting medication last week that made her sleepy and unable to attend PT. Has been compliant with HEP, and walked with a neighbor over the weekend.    Pertinent History Patient is a 54 year old female presenting with weakness, and balance deficits related to MS diagnosis. 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 Her last flare up was Jan 2021, and her balance and fatigue has been worse since causing her to seek out PT. Endorses multiple falls in the past 6 months (at least 5), but does report 3 of them were during a medication transition. Reports she has fallen by "missing a step" going down steps, reaching for water hose on the gross, and mostly falling when changing surfaces. After her last fall she suffered a fracture in her R 3rd toe, without pain. She is a diabetic, and reports she has full sensation in hands  and feet, but that they do tingle often. Reports she has had increased general fatigue following Jan 2021 flare up d/t decreased sleep health (reports she stays up until 3am), and activity induced fatigue, tiring fast with going up flight of stairs, walking > . Patient is on disability currently, but enjoys traveling with her friends/family (has aspirations to go to [place] in March 2022), she does walk around her neighborhood 3x/week for exercise (about ). 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 Pt denies N/V, B&B changes, unexplained weight fluctuation, saddle paresthesia, fever, night sweats, or unrelenting night pain at this time.    Limitations House hold activities;Standing;Walking;Lifting    How long can you sit comfortably? unlimited    How long can you stand comfortably?    How long can you walk comfortably?    Diagnostic tests MRI 05/18/20 Findings compatible with demyelinating disease within the brain. There  is a new 7 mm right periventricular lesion exhibiting mild diffusion restriction suggesting active demyelination. No abnormal T2 hyperintense lesions of the cervical cord.    Patient Stated Goals Increase endurance to usher at church, be able to dance, and increase walking distance.  Gait Training  1.  on 5% incline with cuing for increased stride and foot clearance with good carry over. Decreased DF endurance by end of ambulation Education on energy conservation of taking larger steps to take "less steps" with good understanding   Ther-Ex Seated bilat heel raise x20 with good carry over of demo Duck walks 4x 2ft with cuing to maintain DF with decent carry over Heel taps to 6in cone on 6in step x12; from 2.5in foam x12 with better carry over without lateral whip compensation on LLE L hip flex MMT  retested from max flex position 4-/5; R hip flex 5/5 Leg press LLE 20# 3x 10 with difficulty, attempted RLE much easier for patient  Alt lateral lunges over 6in hurdles 2x 12 with min cuing for technique with good carry over              PT Education - 07/23/20 1126    Education Details therex form/technique, gait training    Person(s) Educated Patient    Methods Explanation;Demonstration;Verbal cues;Tactile cues    Comprehension Verbalized understanding;Returned demonstration;Verbal cues required;Tactile cues required            PT Short Term Goals - 07/09/20 1143      PT SHORT TERM GOAL #1   Title Pt will be independent with HEP in order to improve strength and balance in order to decrease fall risk and improve function at home and work.    Baseline 07/09/20 HEP given    Time 4    Period Weeks    Status New      PT SHORT TERM GOAL #2   Title Pt will decrease 5TSTS by at least 3 seconds in order to demonstrate clinically significant improvement in LE strength.    Baseline 07/09/20 26sec             PT Long Term Goals - 07/16/20 1436      PT LONG TERM GOAL #1   Title Patient will increase FOTO score to 62 to demonstrate predicted increase in functional mobility to complete ADLs    Baseline 07/09/20 57    Time 8    Period Weeks    Status New      PT LONG TERM GOAL #2   Title Pt will decrease 5TSTS by at least 10seconds in order to demonstrate normative BLE strength    Baseline 07/09/20 26sec    Time 8    Period Weeks    Status New      PT LONG TERM GOAL #3   Title Patient will demonstrate an increase in FGA score of at least 4 points to demonstrate clinically significant increase in dynamic balance    Baseline 07/09/20 20/30    Time 8    Period Weeks    Status New      PT LONG TERM GOAL #4   Title Pt will decrease walk speed differential from first 58m and last 46m of to at least 0.98m/s to demonstrate increased muscular and cardiovascular  endurance over community distance    Baseline 07/16/20 0.34m/s (start: 1.57m/s; end 0.55m/s)    Time 8    Period Weeks    Status New                 Plan - 07/23/20 1153    Clinical Impression Statement PT continued therex progression and gait training for increased efficiency of gait, LE strength, and activity tolerance with success. Patinet is able to comply with  all cuing for gait mechanics and therex form/technique, with good motivation and understanding of energy conservation recommendations. PT will continue progression as able.    Personal Factors and Comorbidities Comorbidity 1;Comorbidity 2;Comorbidity 3+;Fitness;Past/Current Experience    Comorbidities HTN, DM, MS    Examination-Activity Limitations Squat;Lift;Stand;Locomotion Level    Examination-Participation Restrictions Psychiatric nurse;Yard Work    Conservation officer, historic buildings Evolving/Moderate complexity    Clinical Decision Making Moderate    Rehab Potential Good    PT Frequency 2x / week    PT Duration 8 weeks    PT Treatment/Interventions ADLs/Self Care Home Management;Cryotherapy;Moist Heat;Electrical Stimulation;DME Instruction;Traction;Ultrasound;Stair training;Gait training;Therapeutic activities;Therapeutic exercise;Neuromuscular re-education;Passive range of motion;Dry needling;Manual techniques;Iontophoresis 4mg /ml Dexamethasone;Functional mobility training;Balance training;Patient/family education;Spinal Manipulations;Joint Manipulations;Energy conservation    PT Next Visit Plan , HEP review    PT Home Exercise Plan bridge, STS, SLS    Consulted and Agree with Plan of Care Patient           Patient will benefit from skilled therapeutic intervention in order to improve the following deficits and impairments:  Abnormal gait, Decreased balance, Decreased endurance, Decreased mobility, Difficulty walking, Decreased activity tolerance, Decreased coordination, Decreased strength,  Increased fascial restricitons, Impaired flexibility, Impaired UE functional use, Postural dysfunction, Pain, Decreased range of motion, Improper body mechanics, Impaired sensation  Visit Diagnosis: Muscle weakness (generalized)  Other abnormalities of gait and mobility  Repeated falls     Problem List There are no problems to display for this patient.  DPT Hilda Lias 07/23/2020, 11:57 AM  [place] [facility] PHYSICAL AND SPORTS MEDICINE [address], [address], [place] Phone: [phone]   Fax:  [phone]  Name: Alicia Acosta MRN: Ezequiel Kayser Date of Birth: Sep 27, 1966

## 2020-07-25 ENCOUNTER — Other Ambulatory Visit: Payer: Self-pay

## 2020-07-25 ENCOUNTER — Ambulatory Visit: Payer: 59 | Admitting: Physical Therapy

## 2020-07-25 DIAGNOSIS — R2689 Other abnormalities of gait and mobility: Secondary | ICD-10-CM

## 2020-07-25 DIAGNOSIS — M6281 Muscle weakness (generalized): Secondary | ICD-10-CM | POA: Diagnosis not present

## 2020-07-25 DIAGNOSIS — R296 Repeated falls: Secondary | ICD-10-CM

## 2020-07-25 NOTE — Therapy (Signed)
[place] [facility] [address] [place], [address] Phone: [phone]   Fax:  [phone]  Physical Therapy Treatment  Patient Details  Name: Alicia Acosta MRN: [id number] Date of Birth: [birth year] No data recorded  Encounter Date: 07/25/2020   PT End of Session - 07/25/20 1126    Visit Number 4    Number of Visits 17    Date for PT Re-Evaluation 09/06/20    PT Start Time 1120    PT Stop Time 1200    PT Time Calculation (min) 40 min    Equipment Utilized During Treatment Gait belt    Activity Tolerance Patient tolerated treatment well    Behavior During Therapy [facility] for tasks assessed/performed           Past Medical History:  Diagnosis Date  . Anxiety   . Depression   . Diabetes mellitus without complication (HCC)   . Hyperlipidemia   . Hypertension   . Vitamin D deficiency     No past surgical history on file.  There were no vitals filed for this visit.   Subjective Assessment - 07/25/20 1123    Subjective Patient reports she was a little tired following last session, but felt "pretty good". She is still trying to walk more, completing HEP.    Pertinent History Patient is a 54 year old female presenting with weakness, and balance deficits related to MS diagnosis. 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 Her last flare up was Jan 2021, and her balance and fatigue has been worse since causing her to seek out PT. Endorses multiple falls in the past 6 months (at least 5), but does report 3 of them were during a medication transition. Reports she has fallen by "missing a step" going down steps, reaching for water hose on the gross, and mostly falling when changing surfaces. After her last fall she suffered a fracture in her R 3rd toe, without pain. She is a diabetic, and reports she has full sensation  in hands and feet, but that they do tingle often. Reports she has had increased general fatigue following Jan 2021 flare up d/t decreased sleep health (reports she stays up until 3am), and activity induced fatigue, tiring fast with going up flight of stairs, walking > . Patient is on disability currently, but enjoys traveling with her friends/family (has aspirations to go to [place] in March 2022), she does walk around her neighborhood 3x/week for exercise (about ). 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 Pt denies N/V, B&B changes, unexplained weight fluctuation, saddle paresthesia, fever, night sweats, or unrelenting night pain at this time.    Limitations House hold activities;Standing;Walking;Lifting    How long can you sit comfortably? unlimited    How long can you stand comfortably?    How long can you walk comfortably?    Diagnostic tests MRI 05/18/20 Findings compatible with demyelinating disease within the brain. There  is a new 7 mm right periventricular lesion exhibiting mild diffusion restriction suggesting active demyelination. No abnormal T2 hyperintense lesions of the cervical cord.    Patient Stated Goals Increase endurance to usher at church, be able to dance, and increase walking distance.  Gait Training  1.  on 10% incline with cuing for increased stride and foot clearance with good carry over. Decreased DF endurance by end of ambulation Education on energy conservation of taking larger steps to take "less steps" with good understanding  Ther-Ex Seated bilat heel raise x20 with good carry over of demo Duck walks 4x 65ft with cuing to maintain DF with decent carry over Heel taps to 6in cone on 6in step 2x 12 with better carry over without whip compensation than last session SL squat TRX  unilateral support 2x 10 bilat with demo and cuing for technique, unable to complete without CL toe touch between reps Squat with 5# DB bilat UE overhead press 3x 10 with good carry over of demo for technique SL hip hinge with slider for increased stability x6 bilat                        PT Education - 07/25/20 1125    Education Details therex form/technique, gait training    Person(s) Educated Patient    Methods Explanation;Demonstration;Verbal cues    Comprehension Verbalized understanding;Returned demonstration;Verbal cues required            PT Short Term Goals - 07/09/20 1143      PT SHORT TERM GOAL #1   Title Pt will be independent with HEP in order to improve strength and balance in order to decrease fall risk and improve function at home and work.    Baseline 07/09/20 HEP given    Time 4    Period Weeks    Status New      PT SHORT TERM GOAL #2   Title Pt will decrease 5TSTS by at least 3 seconds in order to demonstrate clinically significant improvement in LE strength.    Baseline 07/09/20 26sec             PT Long Term Goals - 07/16/20 1436      PT LONG TERM GOAL #1   Title Patient will increase FOTO score to 62 to demonstrate predicted increase in functional mobility to complete ADLs    Baseline 07/09/20 57    Time 8    Period Weeks    Status New      PT LONG TERM GOAL #2   Title Pt will decrease 5TSTS by at least 10seconds in order to demonstrate normative BLE strength    Baseline 07/09/20 26sec    Time 8    Period Weeks    Status New      PT LONG TERM GOAL #3   Title Patient will demonstrate an increase in FGA score of at least 4 points to demonstrate clinically significant increase in dynamic balance    Baseline 07/09/20 20/30    Time 8    Period Weeks    Status New      PT LONG TERM GOAL #4   Title Pt will decrease walk speed differential from first 71m and last 59m of to at least 0.43m/s to demonstrate increased  muscular and cardiovascular endurance over community distance    Baseline 07/16/20 0.69m/s (start: 1.73m/s; end 0.44m/s)    Time 8    Period Weeks    Status New                 Plan - 07/25/20 1135    Clinical Impression Statement PT continued therex progression for increased endurance and BLE strength with functional movements. Patient able to complete  all planned therex with rest breaks with good carry over of all multimodal cuing. Patient is motivated throughout session without increased pain, and increased standing tolerance. PT will continue progression as able.    Personal Factors and Comorbidities Comorbidity 1;Comorbidity 2;Comorbidity 3+;Fitness;Past/Current Experience    Comorbidities HTN, DM, MS    Examination-Activity Limitations Squat;Lift;Stand;Locomotion Level    Examination-Participation Restrictions Psychiatric nurse;Yard Work    Conservation officer, historic buildings Evolving/Moderate complexity    Rehab Potential Good    PT Frequency 2x / week    PT Duration 8 weeks    PT Treatment/Interventions ADLs/Self Care Home Management;Cryotherapy;Moist Heat;Electrical Stimulation;DME Instruction;Traction;Ultrasound;Stair training;Gait training;Therapeutic activities;Therapeutic exercise;Neuromuscular re-education;Passive range of motion;Dry needling;Manual techniques;Iontophoresis 4mg /ml Dexamethasone;Functional mobility training;Balance training;Patient/family education;Spinal Manipulations;Joint Manipulations;Energy conservation    PT Next Visit Plan continue POC    PT Home Exercise Plan bridge, STS, SLS    Consulted and Agree with Plan of Care Patient           Patient will benefit from skilled therapeutic intervention in order to improve the following deficits and impairments:  Abnormal gait, Decreased balance, Decreased endurance, Decreased mobility, Difficulty walking, Decreased activity tolerance, Decreased coordination, Decreased strength, Increased  fascial restricitons, Impaired flexibility, Impaired UE functional use, Postural dysfunction, Pain, Decreased range of motion, Improper body mechanics, Impaired sensation  Visit Diagnosis: Muscle weakness (generalized)  Other abnormalities of gait and mobility  Repeated falls     Problem List There are no problems to display for this patient.  DPT Hilda Lias 07/25/2020, 1:26 PM  [place] [facility] PHYSICAL AND SPORTS MEDICINE [address], [place] Phone: [phone]   Fax:  [phone]  Name: Alicia Acosta MRN: Ezequiel Kayser Date of Birth: 1965-11-27

## 2020-07-29 ENCOUNTER — Other Ambulatory Visit: Payer: Self-pay

## 2020-07-29 ENCOUNTER — Ambulatory Visit: Payer: 59 | Admitting: Physical Therapy

## 2020-07-29 DIAGNOSIS — R296 Repeated falls: Secondary | ICD-10-CM

## 2020-07-29 DIAGNOSIS — M6281 Muscle weakness (generalized): Secondary | ICD-10-CM | POA: Diagnosis not present

## 2020-07-29 DIAGNOSIS — R2689 Other abnormalities of gait and mobility: Secondary | ICD-10-CM

## 2020-07-29 NOTE — Therapy (Signed)
[facility] PHYSICAL AND SPORTS MEDICINE [address] [place], [address] Phone: [phone]   Fax:  [phone]  Physical Therapy Treatment  Patient Details  Name: Alicia Acosta MRN: [id number] Date of Birth: [birth year] No data recorded  Encounter Date: 07/29/2020   PT End of Session - 07/29/20 0835    Visit Number 5    Number of Visits 17    Date for PT Re-Evaluation 09/06/20    PT Start Time 0830    PT Stop Time 0825    PT Time Calculation (min) 1435 min    Equipment Utilized During Treatment Gait belt    Activity Tolerance Patient tolerated treatment well    Behavior During Therapy [facility] for tasks assessed/performed           Past Medical History:  Diagnosis Date  . Anxiety   . Depression   . Diabetes mellitus without complication (HCC)   . Hyperlipidemia   . Hypertension   . Vitamin D deficiency     No past surgical history on file.  There were no vitals filed for this visit.   Subjective Assessment - 07/29/20 0838    Subjective Pt reports she was sore following last PT session and noticed it going up/down steps, but is feeling good overall. Completing HEP    Pertinent History Patient is a 54 year old female presenting with weakness, and balance deficits related to MS diagnosis. 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 Her last flare up was Jan 2021, and her balance and fatigue has been worse since causing her to seek out PT. Endorses multiple falls in the past 6 months (at least 5), but does report 3 of them were during a medication transition. Reports she has fallen by "missing a step" going down steps, reaching for water hose on the gross, and mostly falling when changing surfaces. After her last fall she suffered a fracture in her R 3rd toe, without pain. She is a diabetic, and reports she has full sensation in  hands and feet, but that they do tingle often. Reports she has had increased general fatigue following Jan 2021 flare up d/t decreased sleep health (reports she stays up until 3am), and activity induced fatigue, tiring fast with going up flight of stairs, walking > . Patient is on disability currently, but enjoys traveling with her friends/family (has aspirations to go to [place] in March 2022), she does walk around her neighborhood 3x/week for exercise (about ). 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 Pt denies N/V, B&B changes, unexplained weight fluctuation, saddle paresthesia, fever, night sweats, or unrelenting night pain at this time.    Limitations House hold activities;Standing;Walking;Lifting    How long can you sit comfortably? unlimited    How long can you stand comfortably?    How long can you walk comfortably?    Diagnostic tests MRI 05/18/20 Findings compatible with demyelinating disease within the brain. There  is a new 7 mm right periventricular lesion exhibiting mild diffusion restriction suggesting active demyelination. No abnormal T2 hyperintense lesions of the cervical cord.    Patient Stated Goals Increase endurance to usher at church, be able to dance, and increase walking distance.  Gait Training 1.  on 10% incline with cuing for increased stride and foot clearance with good carry over. Decreased DF endurance by end of ambulation Education on energy conservation of taking larger steps to take "less steps" with good understanding  Ther-Ex Seated bilat heel raise x20 with good carry over of demo Sidestepping GTB 61ft R/standing rest/ 81ft L; then constant 42ft L then R without rest between  [place] walks 37ft R/standing rest/ 64ft L; then constant 40ft L then R without rest between  session SL squat TRX unilateral support 2x 10 bilat with demo and cuing for technique, unable to complete without CL toe touch between reps                          PT Education - 07/29/20 0834    Education Details therex form/technique, gait training    Person(s) Educated Patient    Methods Explanation;Demonstration;Verbal cues    Comprehension Verbalized understanding;Returned demonstration;Verbal cues required            PT Short Term Goals - 07/09/20 1143      PT SHORT TERM GOAL #1   Title Pt will be independent with HEP in order to improve strength and balance in order to decrease fall risk and improve function at home and work.    Baseline 07/09/20 HEP given    Time 4    Period Weeks    Status New      PT SHORT TERM GOAL #2   Title Pt will decrease 5TSTS by at least 3 seconds in order to demonstrate clinically significant improvement in LE strength.    Baseline 07/09/20 26sec             PT Long Term Goals - 07/16/20 1436      PT LONG TERM GOAL #1   Title Patient will increase FOTO score to 62 to demonstrate predicted increase in functional mobility to complete ADLs    Baseline 07/09/20 57    Time 8    Period Weeks    Status New      PT LONG TERM GOAL #2   Title Pt will decrease 5TSTS by at least 10seconds in order to demonstrate normative BLE strength    Baseline 07/09/20 26sec    Time 8    Period Weeks    Status New      PT LONG TERM GOAL #3   Title Patient will demonstrate an increase in FGA score of at least 4 points to demonstrate clinically significant increase in dynamic balance    Baseline 07/09/20 20/30    Time 8    Period Weeks    Status New      PT LONG TERM GOAL #4   Title Pt will decrease walk speed differential from first 28m and last 50m of to at least 0.4m/s to demonstrate increased muscular and cardiovascular endurance over community distance    Baseline 07/16/20 0.77m/s (start: 1.51m/s; end 0.104m/s)     Time 8    Period Weeks    Status New                 Plan - 07/29/20 0849    Clinical Impression Statement Session shortened d/t patient arriving late. Patient is able to comply with all cuing for proper technique of therex following multimodal cuing, and encouragement. Patient is motivated throughout session and is able to complete planned therex with appropriate rests. PT will continue progression  as able.    Personal Factors and Comorbidities Comorbidity 1;Comorbidity 2;Comorbidity 3+;Fitness;Past/Current Experience    Comorbidities HTN, DM, MS    Examination-Activity Limitations Squat;Lift;Stand;Locomotion Level    Examination-Participation Restrictions Psychiatric nurse;Yard Work    Conservation officer, historic buildings Evolving/Moderate complexity    Clinical Decision Making Moderate    Rehab Potential Good    PT Frequency 2x / week    PT Duration 8 weeks    PT Treatment/Interventions ADLs/Self Care Home Management;Cryotherapy;Moist Heat;Electrical Stimulation;DME Instruction;Traction;Ultrasound;Stair training;Gait training;Therapeutic activities;Therapeutic exercise;Neuromuscular re-education;Passive range of motion;Dry needling;Manual techniques;Iontophoresis 4mg /ml Dexamethasone;Functional mobility training;Balance training;Patient/family education;Spinal Manipulations;Joint Manipulations;Energy conservation    PT Next Visit Plan continue POC    PT Home Exercise Plan bridge, STS, SLS    Consulted and Agree with Plan of Care Patient           Patient will benefit from skilled therapeutic intervention in order to improve the following deficits and impairments:  Abnormal gait, Decreased balance, Decreased endurance, Decreased mobility, Difficulty walking, Decreased activity tolerance, Decreased coordination, Decreased strength, Increased fascial restricitons, Impaired flexibility, Impaired UE functional use, Postural dysfunction, Pain, Decreased range of motion,  Improper body mechanics, Impaired sensation  Visit Diagnosis: Muscle weakness (generalized)  Other abnormalities of gait and mobility  Repeated falls     Problem List There are no problems to display for this patient.  DPT Hilda Lias 07/29/2020, 8:54 AM  [facility] PHYSICAL AND SPORTS MEDICINE [address], [address], [place] Phone: [phone]   Fax:  [phone]  Name: CHRISTABELL LOSEKE MRN: Ezequiel Kayser Date of Birth: Feb 13, 1966

## 2020-08-01 ENCOUNTER — Encounter: Payer: Self-pay | Admitting: Physical Therapy

## 2020-08-01 ENCOUNTER — Ambulatory Visit: Payer: 59 | Admitting: Physical Therapy

## 2020-08-01 ENCOUNTER — Other Ambulatory Visit: Payer: Self-pay

## 2020-08-01 DIAGNOSIS — M6281 Muscle weakness (generalized): Secondary | ICD-10-CM

## 2020-08-01 DIAGNOSIS — R2689 Other abnormalities of gait and mobility: Secondary | ICD-10-CM

## 2020-08-01 DIAGNOSIS — R296 Repeated falls: Secondary | ICD-10-CM

## 2020-08-01 NOTE — Therapy (Signed)
[facility]TS [facility]ICINE [address], [place], [address] Phone: [phone]   Fax:  [phone]  Physical Therapy Treatment  Patient Details  Name: Alicia Acosta MRN: [id number] Date of Birth: 1966/02/11 No data recorded  Encounter Date: 08/01/2020   PT End of Session - 08/01/20 1125    Visit Number 6    Number of Visits 17    Date for PT Re-Evaluation 09/06/20    PT Start Time 1117    PT Stop Time 1200    PT Time Calculation (min) 43 min    Activity Tolerance Patient tolerated treatment well    Behavior During Therapy [facility] for tasks assessed/performed           Past Medical History:  Diagnosis Date  . Anxiety   . Depression   . Diabetes mellitus without complication (HCC)   . Hyperlipidemia   . Hypertension   . Vitamin D deficiency     History reviewed. No pertinent surgical history.  There were no vitals filed for this visit.   Subjective Assessment - 08/01/20 1123    Subjective PT reports following last session she wasn't too sore or tired. Reports some lingering soreness on lateral bilat hips. Compliance with HEP.    Pertinent History Patient is a 53 year old female presenting with weakness, and balance deficits related to MS diagnosis. 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 Her last flare up was Jan 2021, and her balance and fatigue has been worse since causing her to seek out PT. Endorses multiple falls in the past 6 months (at least 5), but does report 3 of them were during a medication transition. Reports she has fallen by "missing a step" going down steps, reaching for water hose on the gross, and mostly falling when changing surfaces. After her last fall she suffered a fracture in her R 3rd toe, without pain. She is a diabetic, and reports she has full sensation in hands and feet, but that they  do tingle often. Reports she has had increased general fatigue following Jan 2021 flare up d/t decreased sleep health (reports she stays up until 3am), and activity induced fatigue, tiring fast with going up flight of stairs, walking > . Patient is on disability currently, but enjoys traveling with her friends/family (has aspirations to go to [place] in March 2022), she does walk around her neighborhood 3x/week for exercise (about ). 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 Pt denies N/V, B&B changes, unexplained weight fluctuation, saddle paresthesia, fever, night sweats, or unrelenting night pain at this time.    Limitations House hold activities;Standing;Walking;Lifting    How long can you sit comfortably? unlimited    How long can you stand comfortably?    How long can you walk comfortably?    Diagnostic tests MRI 05/18/20 Findings compatible with demyelinating disease within the brain. There  is a new 7 mm right periventricular lesion exhibiting mild diffusion restriction suggesting active demyelination. No abnormal T2 hyperintense lesions of the cervical cord.    Patient Stated Goals Increase endurance to usher at church, be able to dance, and increase walking distance.               Gait  Training 1.  on10% incline c 2# AW; addition of BlackTB resistance for increased hip ext ( total) with cuing for upright posture with good carry over, better foot clearance with AWs. Decreased DF endurance by end of ambulation Education on energy conservation of taking larger steps to take "less steps" with good understanding  Ther-Ex [place] walks GTB 4x  51ft with 2# AW bilat with cuing for "forward momentum" with good carry over With 2# AW alt heel taps onto 6in 2x 30sec;cuing for DF with good carry over most taps  18 Step up/down with 2# AW 2x 30sec  [place] walks 2x 17ft ; then constant 46ft with 180d turn with min cuing for maintained DF Squat with overhead press 2# DB bilat 3x 30sec; most stands: 73               PT Education - 08/01/20 1124    Education Details therex form/technique, gait training    Person(s) Educated Patient    Methods Explanation;Demonstration;Verbal cues    Comprehension Verbalized understanding;Returned demonstration;Verbal cues required            PT Short Term Goals - 07/09/20 1143      PT SHORT TERM GOAL #1   Title Pt will be independent with HEP in order to improve strength and balance in order to decrease fall risk and improve function at home and work.    Baseline 07/09/20 HEP given    Time 4    Period Weeks    Status New      PT SHORT TERM GOAL #2   Title Pt will decrease 5TSTS by at least 3 seconds in order to demonstrate clinically significant improvement in LE strength.    Baseline 07/09/20 26sec             PT Long Term Goals - 07/16/20 1436      PT LONG TERM GOAL #1   Title Patient will increase FOTO score to 62 to demonstrate predicted increase in functional mobility to complete ADLs    Baseline 07/09/20 57    Time 8    Period Weeks    Status New      PT LONG TERM GOAL #2   Title Pt will decrease 5TSTS by at least 10seconds in order to demonstrate normative BLE strength    Baseline 07/09/20 26sec    Time 8    Period Weeks    Status New      PT LONG TERM GOAL #3   Title Patient will demonstrate an increase in FGA score of at least 4 points to demonstrate clinically significant increase in dynamic balance    Baseline 07/09/20 20/30    Time 8    Period Weeks    Status New      PT LONG TERM GOAL #4   Title Pt will decrease walk speed differential from first 21m and last 56m of to at least 0.3m/s to demonstrate increased muscular and cardiovascular endurance over community distance    Baseline 07/16/20 0.81m/s  (start: 1.63m/s; end 0.10m/s)    Time 8    Period Weeks    Status New                 Plan - 08/01/20 1141    Clinical Impression Statement PT continued therex and gait progression for increased endurance and upright tolerance with success. Patient is motivated throughout session without increased pain. Patient is able to comply with all cuing for proper technique  of therex for safety. Demonstrates better foot clearance with resistance/high level gait, with carry over cuing for maintained DF and foot clearance. PT will continue progresison as able.    Personal Factors and Comorbidities Comorbidity 1;Comorbidity 2;Comorbidity 3+;Fitness;Past/Current Experience    Comorbidities HTN, DM, MS    Examination-Activity Limitations Squat;Lift;Stand;Locomotion Level    Examination-Participation Restrictions Psychiatric nurse;Yard Work    Conservation officer, historic buildings Evolving/Moderate complexity    Clinical Decision Making Moderate    Rehab Potential Good    PT Frequency 2x / week    PT Duration 8 weeks    PT Treatment/Interventions ADLs/Self Care Home Management;Cryotherapy;Moist Heat;Electrical Stimulation;DME Instruction;Traction;Ultrasound;Stair training;Gait training;Therapeutic activities;Therapeutic exercise;Neuromuscular re-education;Passive range of motion;Dry needling;Manual techniques;Iontophoresis 4mg /ml Dexamethasone;Functional mobility training;Balance training;Patient/family education;Spinal Manipulations;Joint Manipulations;Energy conservation    PT Next Visit Plan continue POC    PT Home Exercise Plan bridge, STS, SLS    Consulted and Agree with Plan of Care Patient           Patient will benefit from skilled therapeutic intervention in order to improve the following deficits and impairments:  Abnormal gait, Decreased balance, Decreased endurance, Decreased mobility, Difficulty walking, Decreased activity tolerance, Decreased coordination, Decreased  strength, Increased fascial restricitons, Impaired flexibility, Impaired UE functional use, Postural dysfunction, Pain, Decreased range of motion, Improper body mechanics, Impaired sensation  Visit Diagnosis: Muscle weakness (generalized)  Other abnormalities of gait and mobility  Repeated falls     Problem List There are no problems to display for this patient.  DPT Hilda Lias 08/01/2020, 11:57 AM  [facility] PHYSICAL AND SPORTS MEDICINE [address], [place] Phone: [phone]   Fax:  [phone]  Name: Alicia Acosta MRN: Ezequiel Kayser Date of Birth: 11-11-1965

## 2020-08-06 ENCOUNTER — Ambulatory Visit: Payer: 59 | Admitting: Physical Therapy

## 2020-08-08 ENCOUNTER — Encounter: Payer: 59 | Admitting: Physical Therapy

## 2020-08-13 ENCOUNTER — Ambulatory Visit: Payer: 59 | Admitting: Physical Therapy

## 2020-08-15 ENCOUNTER — Ambulatory Visit: Payer: 59 | Admitting: Physical Therapy

## 2020-08-20 ENCOUNTER — Ambulatory Visit: Payer: 59 | Admitting: Physical Therapy

## 2020-08-22 ENCOUNTER — Other Ambulatory Visit: Payer: Self-pay

## 2020-08-22 ENCOUNTER — Ambulatory Visit: Payer: 59 | Attending: Neurology | Admitting: Physical Therapy

## 2020-08-22 ENCOUNTER — Encounter: Payer: Self-pay | Admitting: Physical Therapy

## 2020-08-22 DIAGNOSIS — M6281 Muscle weakness (generalized): Secondary | ICD-10-CM | POA: Diagnosis not present

## 2020-08-22 DIAGNOSIS — R296 Repeated falls: Secondary | ICD-10-CM | POA: Insufficient documentation

## 2020-08-22 DIAGNOSIS — R2689 Other abnormalities of gait and mobility: Secondary | ICD-10-CM | POA: Diagnosis present

## 2020-08-22 NOTE — Therapy (Signed)
[place] [facility] [address], [place], [address] Phone: [phone]   Fax:  [phone]  Physical Therapy Treatment/Progress Note Reporting Period 07/16/20 - 08/22/20  Patient Details  Name: Alicia Acosta MRN: [id number] Date of Birth: Jun 18, 1966 No data recorded  Encounter Date: 08/22/2020   PT End of Session - 08/22/20 1321    Visit Number 7    Number of Visits 17    Date for PT Re-Evaluation 09/06/20    PT Start Time 0111    PT Stop Time 0145    PT Time Calculation (min) 34 min    Equipment Utilized During Treatment Gait belt    Activity Tolerance Patient tolerated treatment well    Behavior During Therapy WFL for tasks assessed/performed           Past Medical History:  Diagnosis Date   Anxiety    Depression    Diabetes mellitus without complication (HCC)    Hyperlipidemia    Hypertension    Vitamin D deficiency     History reviewed. No pertinent surgical history.  There were no vitals filed for this visit.   Subjective Assessment - 08/22/20 1318    Subjective Has been absent from PT d/t broken "middle toe of R foot" that she has been having trouble with, is swollen and not healing properly, Has been followed by MD for this, not placed in boot. Has been  completing HEP.    Pertinent History Patient is a 54 year old female presenting with weakness, and balance deficits related to MS diagnosis. 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 Her last flare up was Jan 2021, and her balance and fatigue has been worse since causing her to seek out PT. Endorses multiple falls in the past 6 months (at least 5), but does report 3 of them were during a medication transition. Reports she has fallen by "missing a step" going down steps, reaching for water hose on the gross, and mostly falling when  changing surfaces. After her last fall she suffered a fracture in her R 3rd toe, without pain. She is a diabetic, and reports she has full sensation in hands and feet, but that they do tingle often. Reports she has had increased general fatigue following Jan 2021 flare up d/t decreased sleep health (reports she stays up until 3am), and activity induced fatigue, tiring fast with going up flight of stairs, walking > . Patient is on disability currently, but enjoys traveling with her friends/family (has aspirations to go to [place] in March 2022), she does walk around her neighborhood 3x/week for exercise (about ). 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 Pt denies N/V, B&B changes, unexplained weight fluctuation, saddle paresthesia, fever, night sweats, or unrelenting night pain at this time.    Limitations House hold activities;Standing;Walking;Lifting    How long can you sit comfortably? unlimited    How long can you stand comfortably?    How long can you walk comfortably?    Diagnostic tests MRI 05/18/20 Findings compatible with demyelinating disease within the brain. There  is a new 7 mm right periventricular lesion exhibiting mild diffusion restriction suggesting active demyelination. No abnormal T2 hyperintense lesions of the cervical cord.  Patient Stated Goals Increase endurance to usher at church, be able to dance, and increase walking distance.           Ther-Ex 5xSTS FGA with supervision for all tasks, min guard for reverse walking, education of findings and correlation to dynamic balance.  PT reviewed the following HEP with patient with patient able to demonstrate a set of the following with min cuing for correction needed. PT educated patient on parameters of therex (how/when to inc/decrease intensity, frequency, rep/set  range, stretch hold time, and purpose of therex) with verbalized understanding.  Access Code: [email or web address] Side Stepping with Resistance at Ankles - 1 x daily - 2-3 x weekly - 3 sets - 12 reps Sit to Stand - 1 x daily - 2-3 x weekly - 3 sets - 12 reps Supine Bridge - 1 x daily - 2-3 x weekly - 3 sets - 12 reps Single Leg Stance - 3 x daily - 7 x weekly - 30sec hold   Gait training with gait speed and quality assessed throughout, min encouragement                        PT Education - 08/22/20 1320    Education Details therex form/technique, gait training    Person(s) Educated Patient    Methods Explanation;Demonstration;Verbal cues    Comprehension Verbalized understanding;Returned demonstration;Verbal cues required            PT Short Term Goals - 08/22/20 1314      PT SHORT TERM GOAL #1   Title Pt will be independent with HEP in order to improve strength and balance in order to decrease fall risk and improve function at home and work.    Baseline 07/09/20 HEP given; 08/22/20 Completing regularly    Time 4    Period Weeks    Status Achieved      PT SHORT TERM GOAL #2   Title Pt will decrease 5TSTS by at least 3 seconds in order to demonstrate clinically significant improvement in LE strength.    Baseline 07/09/20 26sec; 08/22/20 17.4    Time 4    Period Weeks    Status Achieved             PT Long Term Goals - 08/22/20 1315      PT LONG TERM GOAL #1   Title Patient will increase FOTO score to 62 to demonstrate predicted increase in functional mobility to complete ADLs    Baseline 07/09/20 57; 08/22/20 61    Time 8    Period Weeks    Status On-going      PT LONG TERM GOAL #2   Title Pt will decrease 5TSTS by at least 10seconds in order to demonstrate normative BLE strength    Baseline 07/09/20 26sec; 08/22/20 17.4sec    Time 8    Period Weeks    Status On-going      PT LONG TERM GOAL #3   Title Patient will demonstrate an increase in FGA  score of at least 4 points to demonstrate clinically significant increase in dynamic balance    Baseline 07/09/20 20/30; 08/22/20 24/30    Time 8    Period Weeks    Status Revised      PT LONG TERM GOAL #4   Title Pt will decrease walk speed differential from first 67m and last 30m of to at least 0.86m/s to demonstrate increased muscular and cardiovascular endurance over community distance  Baseline 07/16/20 0.15m/s (start: 1.84m/s; end 0.76m/s); 08/22/20 0.59m/s  (start: 1.55m/s; end: 0.70m/s)    Time 8    Period Weeks    Status Achieved      PT LONG TERM GOAL #5   Period Weeks                 Plan - 08/22/20 1353    Clinical Impression Statement PT reassessed patient this visit where patient is making strong progress toward all goals. Patient demonstrates excellent progress in endurance through keeping gait speed pace throughout , improvements in LE strength towards goal of normal BLE strength and increased dynamic balance towaard normal FGA score for her age. PT reviewed HEP with patinet for continuation of progress with suggestion of frequency of 1x/week. PT will continue progression as able.    Personal Factors and Comorbidities Comorbidity 1;Comorbidity 2;Comorbidity 3+;Fitness;Past/Current Experience    Comorbidities HTN, DM, MS    Examination-Activity Limitations Squat;Lift;Stand;Locomotion Level    Examination-Participation Restrictions Psychiatric nurse;Yard Work    Conservation officer, historic buildings Evolving/Moderate complexity    Clinical Decision Making Moderate    Rehab Potential Good    PT Frequency 2x / week    PT Duration 8 weeks    PT Treatment/Interventions ADLs/Self Care Home Management;Cryotherapy;Moist Heat;Electrical Stimulation;DME Instruction;Traction;Ultrasound;Stair training;Gait training;Therapeutic activities;Therapeutic exercise;Neuromuscular re-education;Passive range of motion;Dry needling;Manual  techniques;Iontophoresis 4mg /ml Dexamethasone;Functional mobility training;Balance training;Patient/family education;Spinal Manipulations;Joint Manipulations;Energy conservation    PT Next Visit Plan continue POC    PT Home Exercise Plan bridge, STS, SLS    Consulted and Agree with Plan of Care Patient           Patient will benefit from skilled therapeutic intervention in order to improve the following deficits and impairments:  Abnormal gait, Decreased balance, Decreased endurance, Decreased mobility, Difficulty walking, Decreased activity tolerance, Decreased coordination, Decreased strength, Increased fascial restricitons, Impaired flexibility, Impaired UE functional use, Postural dysfunction, Pain, Decreased range of motion, Improper body mechanics, Impaired sensation  Visit Diagnosis: Muscle weakness (generalized)  Other abnormalities of gait and mobility  Repeated falls     Problem List There are no problems to display for this patient.  DPT Hilda Lias 08/22/2020, 1:56 PM  [facility] PH[facility] AND SPORTS MEDICINE [address], [place] Phone: [phone]   Fax:  [phone]  Name: Alicia Acosta MRN: [id number] Kayser Date of Birth: 07/02/1966

## 2020-10-31 ENCOUNTER — Other Ambulatory Visit: Payer: Self-pay

## 2020-10-31 DIAGNOSIS — Z1231 Encounter for screening mammogram for malignant neoplasm of breast: Secondary | ICD-10-CM

## 2020-11-28 ENCOUNTER — Ambulatory Visit
Admission: RE | Admit: 2020-11-28 | Discharge: 2020-11-28 | Disposition: A | Discharge status: Home / Self Care | Payer: 59 | Source: Ambulatory Visit

## 2020-11-28 ENCOUNTER — Inpatient Hospital Stay
Admission: RE | Admit: 2020-11-28 | Discharge: 2020-11-28 | Disposition: A | Discharge status: Home / Self Care | Payer: Self-pay | Source: Ambulatory Visit | Attending: *Deleted | Admitting: *Deleted

## 2020-11-28 ENCOUNTER — Other Ambulatory Visit: Payer: Self-pay | Admitting: *Deleted

## 2020-11-28 ENCOUNTER — Other Ambulatory Visit: Payer: Self-pay

## 2020-11-28 DIAGNOSIS — Z1231 Encounter for screening mammogram for malignant neoplasm of breast: Secondary | ICD-10-CM

## 2021-03-11 ENCOUNTER — Other Ambulatory Visit: Payer: Self-pay | Admitting: Physician Assistant

## 2021-03-11 ENCOUNTER — Other Ambulatory Visit: Payer: Self-pay

## 2021-03-11 ENCOUNTER — Ambulatory Visit
Admission: RE | Admit: 2021-03-11 | Discharge: 2021-03-11 | Disposition: A | Discharge status: Home / Self Care | Payer: 59 | Source: Ambulatory Visit | Attending: Physician Assistant | Admitting: Physician Assistant

## 2021-03-11 DIAGNOSIS — M7989 Other specified soft tissue disorders: Secondary | ICD-10-CM

## 2021-04-10 ENCOUNTER — Other Ambulatory Visit: Payer: 59

## 2021-04-12 ENCOUNTER — Emergency Department
Admission: EM | Admit: 2021-04-12 | Discharge: 2021-04-12 | Disposition: A | Discharge status: Home / Self Care | Payer: 59 | Attending: Emergency Medicine | Admitting: Emergency Medicine

## 2021-04-12 ENCOUNTER — Other Ambulatory Visit: Payer: Self-pay

## 2021-04-12 DIAGNOSIS — T383X1A Poisoning by insulin and oral hypoglycemic [antidiabetic] drugs, accidental (unintentional), initial encounter: Secondary | ICD-10-CM | POA: Diagnosis not present

## 2021-04-12 DIAGNOSIS — X58XXXA Exposure to other specified factors, initial encounter: Secondary | ICD-10-CM | POA: Insufficient documentation

## 2021-04-12 DIAGNOSIS — T50901A Poisoning by unspecified drugs, medicaments and biological substances, accidental (unintentional), initial encounter: Secondary | ICD-10-CM

## 2021-04-12 DIAGNOSIS — E11649 Type 2 diabetes mellitus with hypoglycemia without coma: Secondary | ICD-10-CM | POA: Insufficient documentation

## 2021-04-12 DIAGNOSIS — E162 Hypoglycemia, unspecified: Secondary | ICD-10-CM

## 2021-04-12 DIAGNOSIS — Z7982 Long term (current) use of aspirin: Secondary | ICD-10-CM | POA: Diagnosis not present

## 2021-04-12 DIAGNOSIS — I10 Essential (primary) hypertension: Secondary | ICD-10-CM | POA: Diagnosis not present

## 2021-04-12 DIAGNOSIS — Z79899 Other long term (current) drug therapy: Secondary | ICD-10-CM | POA: Insufficient documentation

## 2021-04-12 DIAGNOSIS — Z794 Long term (current) use of insulin: Secondary | ICD-10-CM | POA: Diagnosis not present

## 2021-04-12 LAB — CBG MONITORING, ED
Glucose-Capillary: 65 mg/dL — ABNORMAL LOW (ref 70–99)
Glucose-Capillary: 293 mg/dL — ABNORMAL HIGH (ref 70–99)

## 2021-04-12 MED ORDER — DEXTROSE 50 % IV SOLN
25.0000 mL | Freq: Once | INTRAVENOUS | Status: AC
  Administered 2021-04-12: 25 mL via INTRAVENOUS

## 2021-04-12 MED ORDER — DEXTROSE 50 % IV SOLN: 1.0000 | Freq: Once | INTRAVENOUS | Status: AC

## 2021-04-12 MED ORDER — DEXTROSE 50 % IV SOLN
1.0000 | Freq: Once | INTRAVENOUS | Status: DC
  Filled 2021-04-12: qty 50

## 2021-04-12 MED ORDER — DEXTROSE 50 % IV SOLN
INTRAVENOUS | Status: AC
  Administered 2021-04-12: 50 mL via INTRAVENOUS
  Filled 2021-04-12: qty 50

## 2021-04-12 MED ORDER — DEXTROSE 50 % IV SOLN: 25.0000 mL | Freq: Once | INTRAVENOUS | Status: DC

## 2021-04-12 NOTE — ED Notes (Signed)
cbg 120 

## 2021-04-12 NOTE — ED Notes (Signed)
cbg 90 pt meter

## 2021-04-12 NOTE — ED Notes (Signed)
cbg 152

## 2021-04-12 NOTE — ED Notes (Signed)
Pt checked with own meter CBG 148

## 2021-04-12 NOTE — ED Notes (Signed)
Blood sugar 87 / Pt meter

## 2021-04-12 NOTE — ED Notes (Signed)
CBG 251 Pt meter

## 2021-04-12 NOTE — ED Notes (Signed)
Blood sugar 104 w/ own meter

## 2021-04-12 NOTE — ED Provider Notes (Signed)
[facility] Emergency Department Provider Note   ____________________________________________   I have reviewed the triage vital signs and the nursing notes.   HISTORY  Chief Complaint Hypoglycemia   History limited by: Not Limited   HPI Alicia Acosta is a 55 y.o. female who presents to the emergency department today because of concern for medication error. The patient states that she is supposed to take 35 units of lantus in the morning, but accidentally gave herself 35 units of humalog. The patient states that she did not take any other insulin today. She has been monitoring her sugars and noticed that they were starting to drop. The patient denies any lightheadedness, dizziness or weakness. She denies any recent fevers.   Records reviewed. Per medical record review patient has a history of DM  Past Medical History:  Diagnosis Date   Anxiety    Depression    Diabetes mellitus without complication (HCC)    Hyperlipidemia    Hypertension    Vitamin D deficiency     There are no problems to display for this patient.   No past surgical history on file.  Prior to Admission medications   Medication Sig Start Date End Date Taking? Authorizing Provider  aspirin EC 81 MG tablet Take 81 mg by mouth daily.    [provider]  Cholecalciferol (VITAMIN D3 PO) Take 1 tablet by mouth 2 (two) times daily.     [provider]  clotrimazole-betamethasone (LOTRISONE) cream Apply 1 application topically 2 (two) times daily.    [provider]  Continuous Blood Gluc Receiver (FREESTYLE LIBRE 14 DAY READER) DEVI USE AS NEEDED TO TEST BLOOD SUGAR 06/29/19   [provider]  Continuous Blood Gluc Sensor (FREESTYLE LIBRE 14 DAY SENSOR) MISC Use 1 each every 14 (fourteen) days 02/07/19   [provider]  DULoxetine (CYMBALTA) 60 MG capsule Take 120 mg by mouth daily. 09/08/18   [provider]  gabapentin (NEURONTIN)  100 MG capsule Take 100 mg by mouth as needed.     [provider]  Glatiramer Acetate (COPAXONE) 40 MG/ML SOSY Inject 40 mg into the skin 3 (three) times a week.    [provider]  Glucagon (BAQSIMI ONE PACK) 3 MG/DOSE POWD 1 spray intranasal in case of severe hypoglycemia 12/27/18   [provider]  ibuprofen (ADVIL) 600 MG tablet Take 600 mg by mouth every 6 (six) hours as needed. 07/24/19   [provider]  insulin glargine (LANTUS) 100 UNIT/ML injection Inject 30 Units into the skin daily.     [provider]  insulin lispro (HUMALOG) 100 UNIT/ML injection Inject 15 Units into the skin 3 (three) times daily before meals.     [provider]  losartan-hydrochlorothiazide (HYZAAR) 100-12.5 MG tablet Take 1 tablet by mouth daily.    [provider]  Multiple Vitamin (MULTIVITAMIN) tablet Take 1 tablet by mouth daily.    [provider]  [facility] VERIO test strip  06/26/19   [provider]  pravastatin (PRAVACHOL) 20 MG tablet Take by mouth. 12/11/19 12/10/20  [provider]  ZINC-VITAMIN C PO Take by mouth.    [provider]    Allergies Sulfa antibiotics  Family History  Problem Relation Age of Onset   Breast cancer Sister 18    Social History Social History   Tobacco Use   Smoking status: Never   Smokeless tobacco: Never  Substance Use Topics   Alcohol use: Yes  Drug use: Never    Review of Systems Constitutional: No fever/chills Eyes: No visual changes. ENT: No sore throat. Cardiovascular: Denies chest pain. Respiratory: Denies shortness of breath. Gastrointestinal: No abdominal pain.  No nausea, no vomiting.  No diarrhea.   Genitourinary: Negative for dysuria. Musculoskeletal: Negative for back pain. Skin: Negative for rash. Neurological: Negative for headaches, focal weakness or numbness.  ____________________________________________   PHYSICAL EXAM:  VITAL  SIGNS: ED Triage Vitals  Enc Vitals Group     BP 04/12/21 1146 (!) 170/82     Pulse Rate 04/12/21 1146 92     Resp 04/12/21 1146 18     Temp 04/12/21 1146 97.8 F (36.6 C)     Temp Source 04/12/21 1146 Oral     SpO2 04/12/21 1146 100 %     Weight 04/12/21 1147 200 lb (90.7 kg)     Height 04/12/21 1147 5\' 6"  (1.676 m)     Head Circumference --      Peak Flow --      Pain Score 04/12/21 1147 0     Pain Loc --      Pain Edu? --      Excl. in GC? --      Constitutional: Alert and oriented.  Eyes: Conjunctivae are normal.  ENT      Head: Normocephalic and atraumatic.      Nose: No congestion/rhinnorhea.      Mouth/Throat: Mucous membranes are moist.      Neck: No stridor. Hematological/Lymphatic/Immunilogical: No cervical lymphadenopathy. Cardiovascular: Normal rate, regular rhythm.  No murmurs, rubs, or gallops.  Respiratory: Normal respiratory effort without tachypnea nor retractions. Breath sounds are clear and equal bilaterally. No wheezes/rales/rhonchi. Gastrointestinal: Soft and non tender. No rebound. No guarding.  Genitourinary: Deferred Musculoskeletal: Normal range of motion in all extremities. No lower extremity edema. Neurologic:  Normal speech and language. No gross focal neurologic deficits are appreciated.  Skin:  Skin is warm, dry and intact. No rash noted. Psychiatric: Mood and affect are normal. Speech and behavior are normal. Patient exhibits appropriate insight and judgment.  ____________________________________________    LABS (pertinent positives/negatives)  CBG 65  ____________________________________________   EKG  None  ____________________________________________    RADIOLOGY  None  ____________________________________________   PROCEDURES  Procedures  ____________________________________________   INITIAL IMPRESSION / ASSESSMENT AND PLAN / ED COURSE  Pertinent labs & imaging results that were available during my care of the  patient were reviewed by me and considered in my medical decision making (see chart for details).   Patient presented to the emergency department today after accidentally taking 35 units of humalog rather than her lantus. Plan to monitor patients sugar here n the emergency department and treat hypoglycemia. Do think that once the humalog has gone through its half life and sugars stay stable it would be reasonable to discharge home.  ____________________________________________   FINAL CLINICAL IMPRESSION(S) / ED DIAGNOSES  Final diagnoses:  Accidental medication error, initial encounter  Hypoglycemia     Note: This dictation was prepared with Dragon dictation. Any transcriptional errors that result from this process are unintentional     06/13/21, MD 04/12/21 1425

## 2021-04-12 NOTE — Discharge Instructions (Addendum)
Please seek medical attention for any high fevers, chest pain, shortness of breath, change in behavior, persistent vomiting, bloody stool or any other new or concerning symptoms.  

## 2021-04-12 NOTE — ED Notes (Addendum)
Pt calm ,collective , denied pain or sob. Pt ambulatory without assistance , pt discharge with husband .

## 2021-04-12 NOTE — ED Provider Notes (Signed)
Procedures     ----------------------------------------- 5:36 PM on 04/12/2021 ----------------------------------------- Blood sugar stabilized.  Patient eating.  Not requiring any additional dextrose supplementation.  Stable for discharge     Sharman Cheek, MD 04/12/21 1736

## 2021-04-12 NOTE — ED Notes (Signed)
CBG - 219 

## 2021-04-12 NOTE — ED Triage Notes (Signed)
Pt reports that she accidentally took the wrong insulin. 35 units of Humalog. Her BS is 102. It was 185 before coming into the ED.

## 2021-04-14 LAB — CBG MONITORING, ED
Glucose-Capillary: 38 mg/dL — CL (ref 70–99)
Glucose-Capillary: 111 mg/dL — ABNORMAL HIGH (ref 70–99)
Glucose-Capillary: 152 mg/dL — ABNORMAL HIGH (ref 70–99)
Glucose-Capillary: 219 mg/dL — ABNORMAL HIGH (ref 70–99)

## 2021-04-15 LAB — CBG MONITORING, ED: Glucose-Capillary: 31 mg/dL — CL (ref 70–99)

## 2021-12-11 ENCOUNTER — Other Ambulatory Visit: Payer: Self-pay | Admitting: Cardiology

## 2021-12-11 DIAGNOSIS — R0602 Shortness of breath: Secondary | ICD-10-CM

## 2021-12-11 DIAGNOSIS — R9439 Abnormal result of other cardiovascular function study: Secondary | ICD-10-CM

## 2021-12-11 DIAGNOSIS — E785 Hyperlipidemia, unspecified: Secondary | ICD-10-CM

## 2021-12-17 ENCOUNTER — Telehealth (HOSPITAL_COMMUNITY): Payer: Self-pay | Admitting: Emergency Medicine

## 2021-12-17 DIAGNOSIS — R079 Chest pain, unspecified: Secondary | ICD-10-CM

## 2021-12-17 MED ORDER — IVABRADINE HCL 5 MG PO TABS: 15.0000 mg | ORAL_TABLET | Freq: Once | ORAL | 0 refills | Status: AC

## 2021-12-17 MED ORDER — METOPROLOL TARTRATE 100 MG PO TABS: 100.0000 mg | ORAL_TABLET | Freq: Once | ORAL | 0 refills | Status: AC

## 2021-12-17 NOTE — Telephone Encounter (Signed)
Reaching out to patient to offer assistance regarding upcoming cardiac imaging study; pt verbalizes understanding of appt date/time, parking situation and where to check in, pre-test NPO status and medications ordered, and verified current allergies; name and call back number provided for further questions should they arise ?Rockwell Alexandria RN Navigator Cardiac Imaging ?[facility] ?[phone] office ?[phone] cell ? ?Arrival 215 ?100mg  metoprolol + 15mg  ivabradine ?Denies iv issues ?

## 2021-12-18 ENCOUNTER — Other Ambulatory Visit: Payer: Self-pay

## 2021-12-18 ENCOUNTER — Ambulatory Visit
Admission: RE | Admit: 2021-12-18 | Discharge: 2021-12-18 | Disposition: A | Discharge status: Home / Self Care | Payer: 59 | Source: Ambulatory Visit | Attending: Cardiology | Admitting: Cardiology

## 2021-12-18 DIAGNOSIS — E785 Hyperlipidemia, unspecified: Secondary | ICD-10-CM | POA: Diagnosis present

## 2021-12-18 DIAGNOSIS — R9439 Abnormal result of other cardiovascular function study: Secondary | ICD-10-CM | POA: Insufficient documentation

## 2021-12-18 DIAGNOSIS — R0602 Shortness of breath: Secondary | ICD-10-CM | POA: Insufficient documentation

## 2021-12-18 DIAGNOSIS — R9430 Abnormal result of cardiovascular function study, unspecified: Secondary | ICD-10-CM | POA: Diagnosis not present

## 2021-12-18 LAB — POCT I-STAT CREATININE: Creatinine, Ser: 0.9 mg/dL (ref 0.44–1.00)

## 2021-12-18 MED ORDER — METOPROLOL TARTRATE 5 MG/5ML IV SOLN
10.0000 mg | Freq: Once | INTRAVENOUS | Status: AC
  Administered 2021-12-18: 10 mg via INTRAVENOUS

## 2021-12-18 MED ORDER — DILTIAZEM HCL 25 MG/5ML IV SOLN
10.0000 mg | Freq: Once | INTRAVENOUS | Status: AC
Start: 2021-12-18 — End: 2021-12-18
  Administered 2021-12-18: 10 mg via INTRAVENOUS

## 2021-12-18 MED ORDER — DILTIAZEM HCL 25 MG/5ML IV SOLN
10.0000 mg | Freq: Once | INTRAVENOUS | Status: AC
  Administered 2021-12-18: 10 mg via INTRAVENOUS

## 2021-12-18 MED ORDER — NITROGLYCERIN 0.4 MG SL SUBL
0.8000 mg | SUBLINGUAL_TABLET | Freq: Once | SUBLINGUAL | Status: AC
Start: 2021-12-18 — End: 2021-12-18
  Administered 2021-12-18: 0.8 mg via SUBLINGUAL

## 2021-12-18 MED ORDER — IOHEXOL 350 MG/ML SOLN
100.0000 mL | Freq: Once | INTRAVENOUS | Status: AC | PRN
  Administered 2021-12-18: 100 mL via INTRAVENOUS

## 2021-12-18 NOTE — Progress Notes (Signed)
Patient tolerated CT well. Drank water after. Vital signs stable encourage to drink water throughout day.Reasons explained and verbalized understanding. Ambulated steady gait.  

## 2021-12-22 ENCOUNTER — Ambulatory Visit: Admission: RE | Admit: 2021-12-22 | Payer: 59 | Source: Ambulatory Visit

## 2022-01-05 ENCOUNTER — Other Ambulatory Visit: Payer: Self-pay

## 2022-01-05 ENCOUNTER — Other Ambulatory Visit: Payer: Self-pay | Admitting: Internal Medicine

## 2022-01-05 DIAGNOSIS — Z1231 Encounter for screening mammogram for malignant neoplasm of breast: Secondary | ICD-10-CM

## 2022-02-10 ENCOUNTER — Ambulatory Visit
Admission: RE | Admit: 2022-02-10 | Discharge: 2022-02-10 | Disposition: A | Discharge status: Home / Self Care | Payer: 59 | Source: Ambulatory Visit | Attending: Internal Medicine | Admitting: Internal Medicine

## 2022-02-10 DIAGNOSIS — Z1231 Encounter for screening mammogram for malignant neoplasm of breast: Secondary | ICD-10-CM

## 2022-03-31 IMAGING — MG MM DIGITAL SCREENING BILAT W/ TOMO AND CAD
8 series · 8 of 24 positions shown · non-contrast
Comparison: Previous exam(s).

CLINICAL DATA: Screening.

EXAM:
DIGITAL SCREENING BILATERAL MAMMOGRAM WITH TOMOSYNTHESIS AND CAD
TECHNIQUE: Bilateral screening digital craniocaudal and mediolateral oblique
mammograms were obtained. Bilateral screening digital breast
tomosynthesis was performed. The images were evaluated with
computer-aided detection.

[R MLO synth-2D]
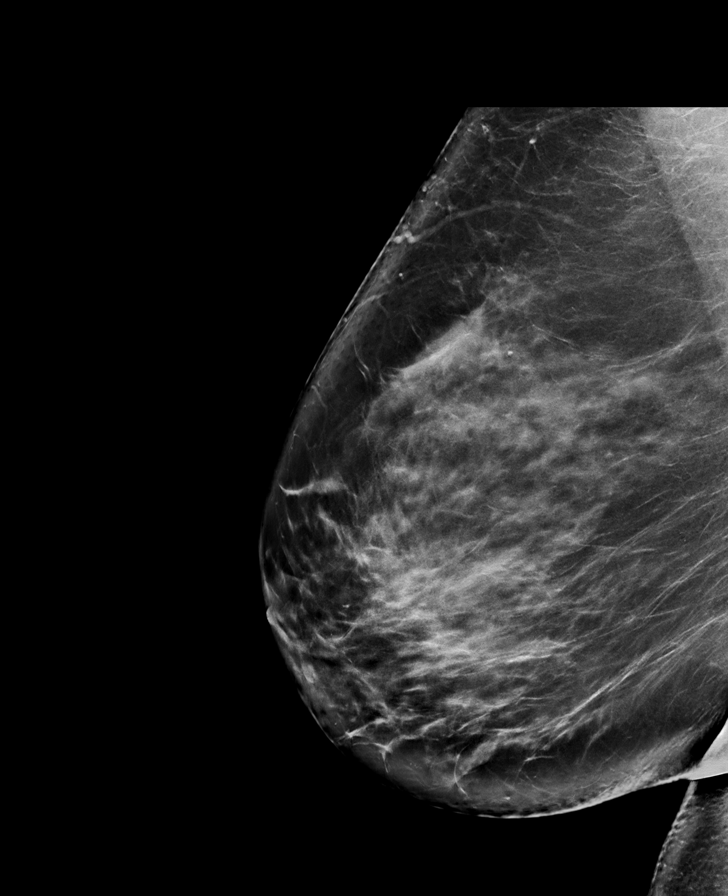

[R CC synth-2D]
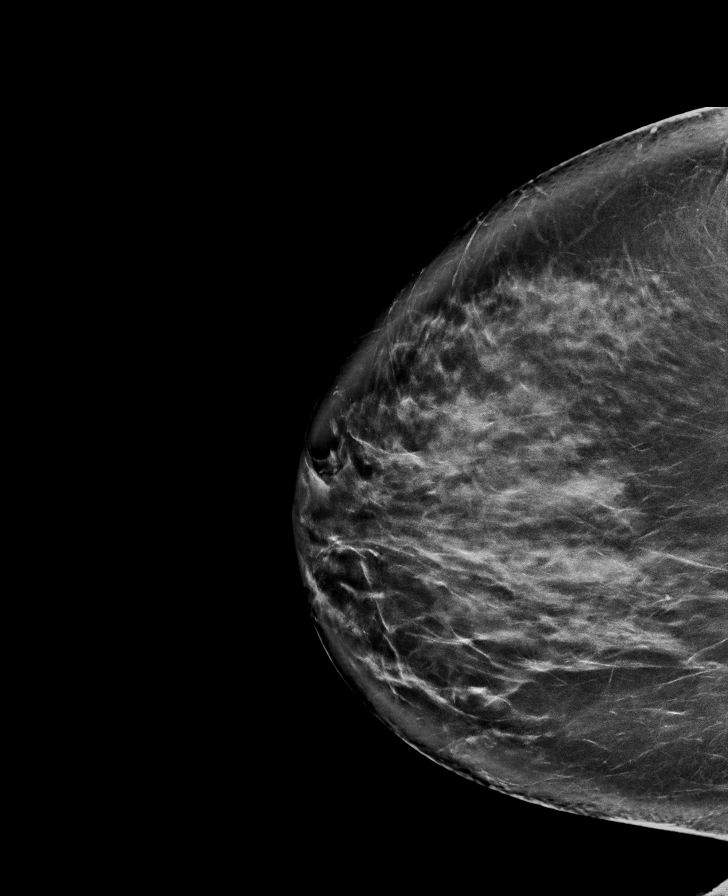

[L CC synth-2D]
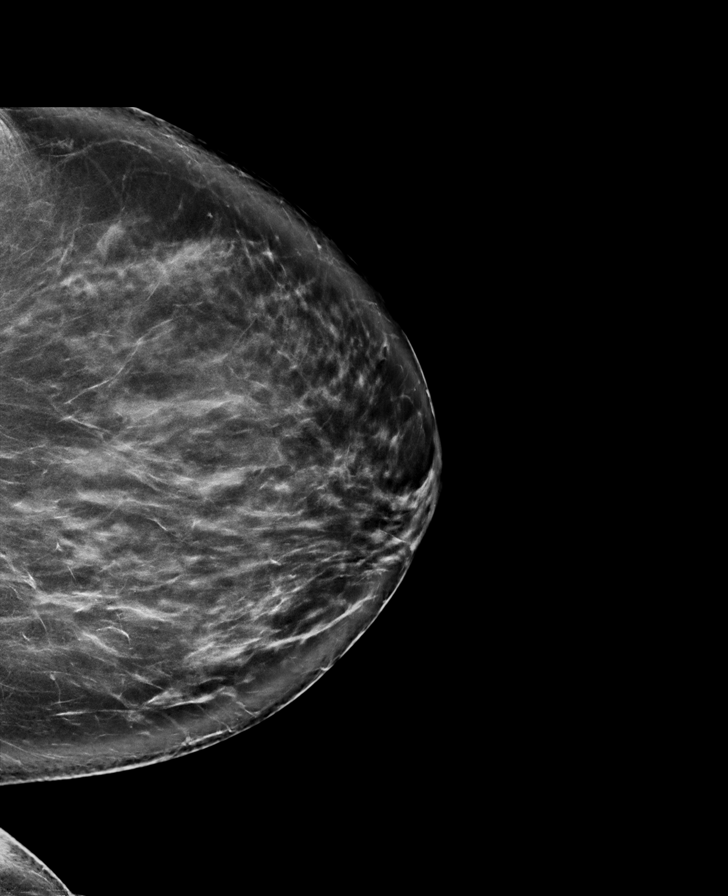

[L MLO synth-2D]
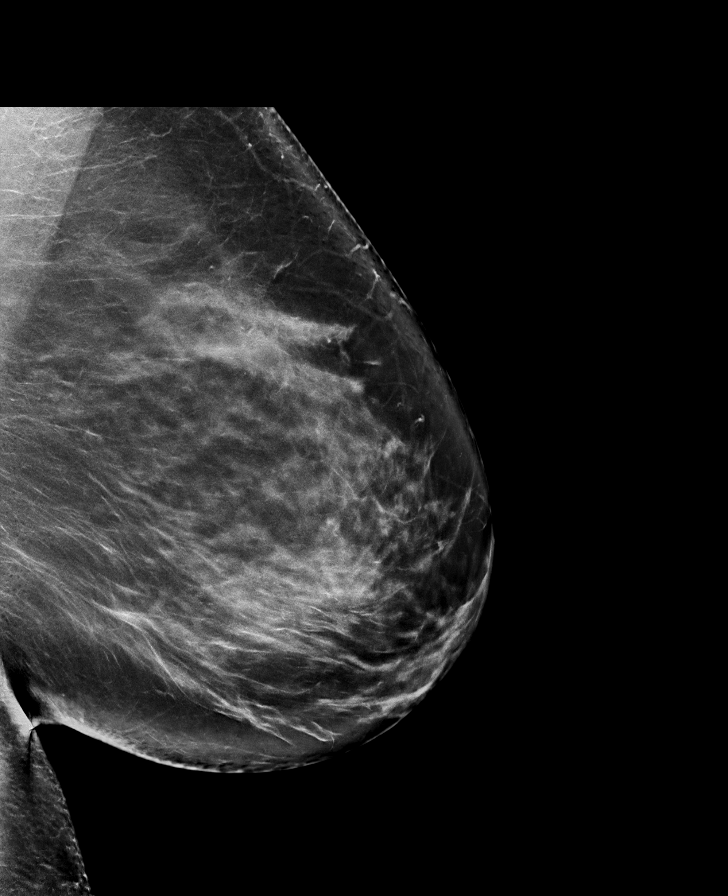

[L CC tomo · tomo slice 51/100.0]
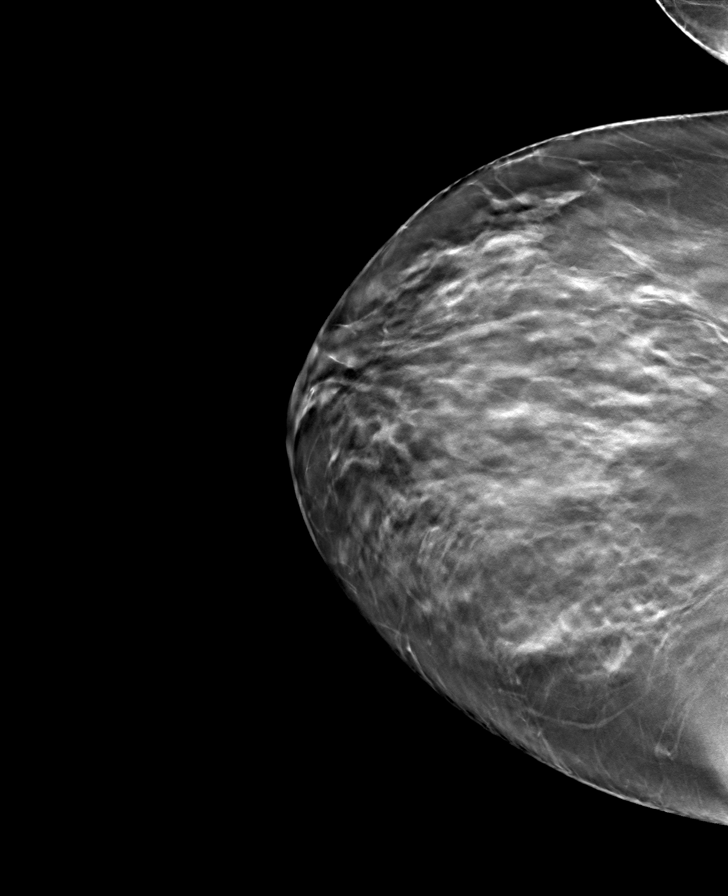

[R MLO tomo · tomo slice 53/104.0]
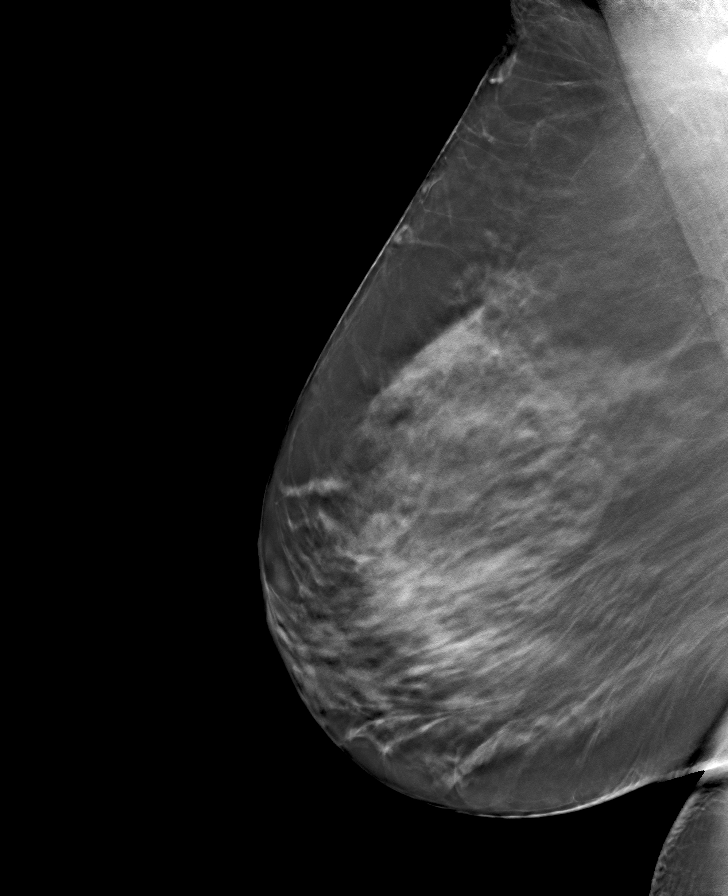

[L MLO tomo · tomo slice 55/108.0]
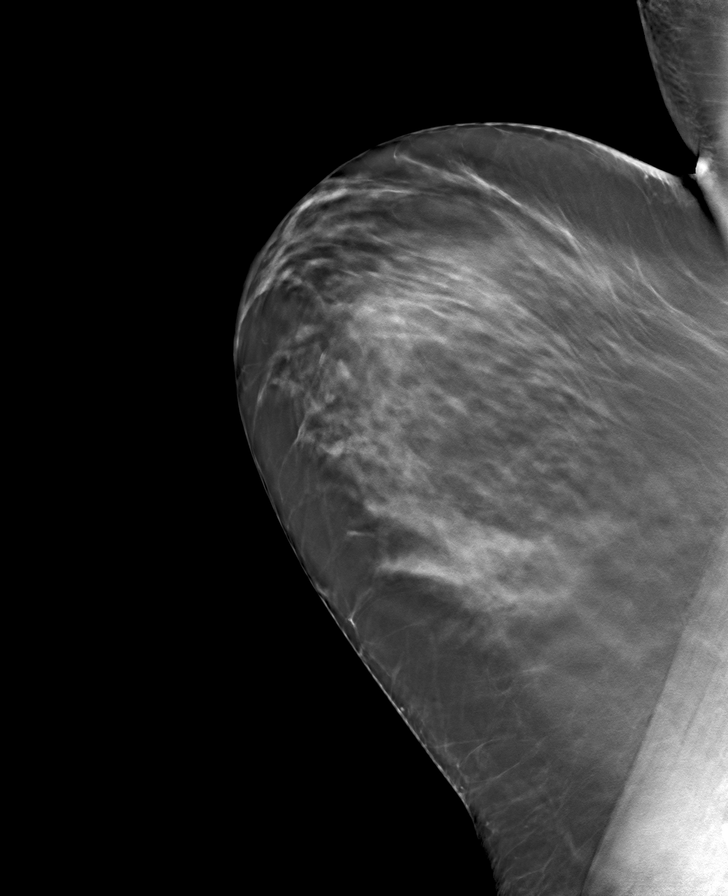

[R CC tomo · tomo slice 49/97.0]
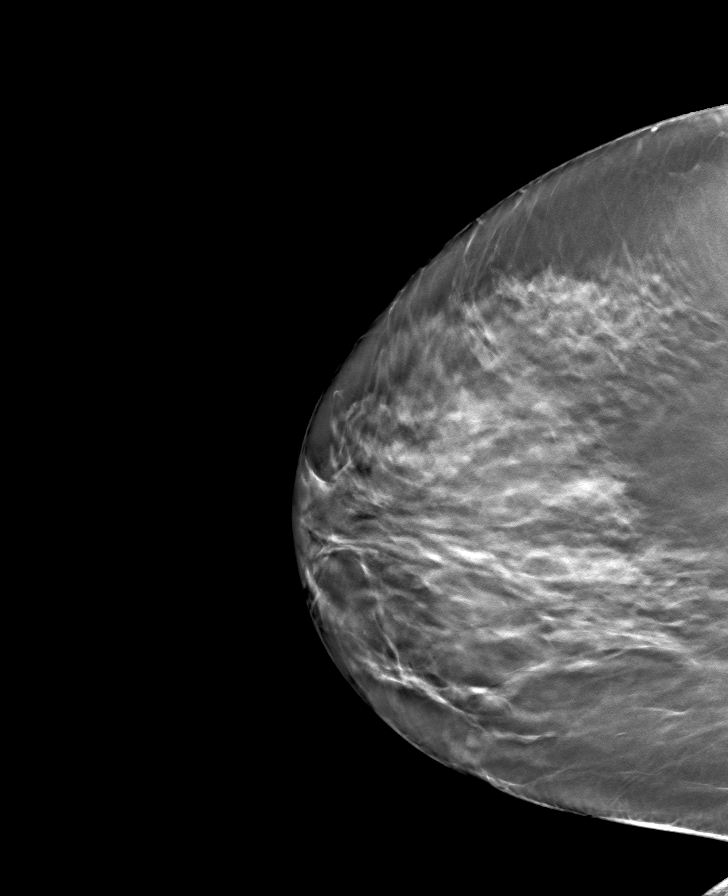

[8 of 24 positions shown; findings below may reference images not displayed]

ACR Breast Density Category c: The breast tissue is heterogeneously
dense, which may obscure small masses.
FINDINGS: There are no findings suspicious for malignancy. The images were
evaluated with computer-aided detection.
IMPRESSION: No mammographic evidence of malignancy. A result letter of this
screening mammogram will be mailed directly to the patient.

RECOMMENDATION:
Screening mammogram in one year. (Code:T4-5-GWO)

BI-RADS CATEGORY  1: Negative.

## 2022-07-12 IMAGING — US US EXTREM LOW VENOUS*L*
1 series · 13 of 24 positions shown · non-contrast
Comparison: Ultrasound March 15, 2020.

CLINICAL DATA: Left leg swelling.



[Series 1: us venous img lower uni left (dvt) · portal-venous · 13 of 37 slices shown]
[im 1/37]
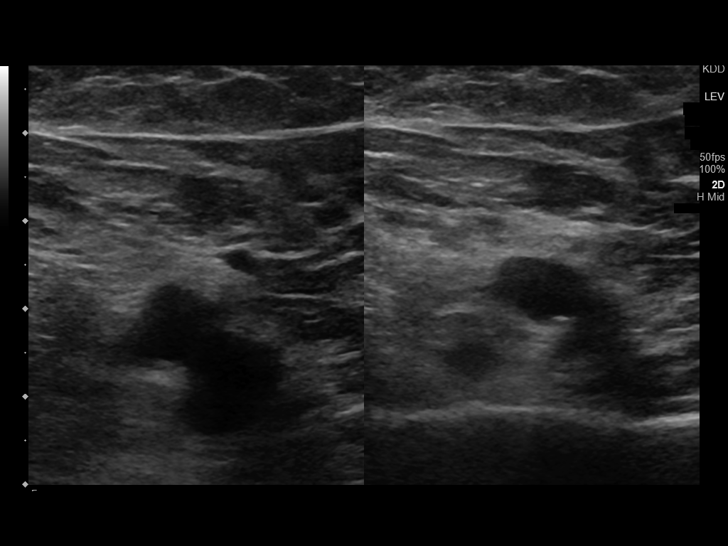
[im 4/37]
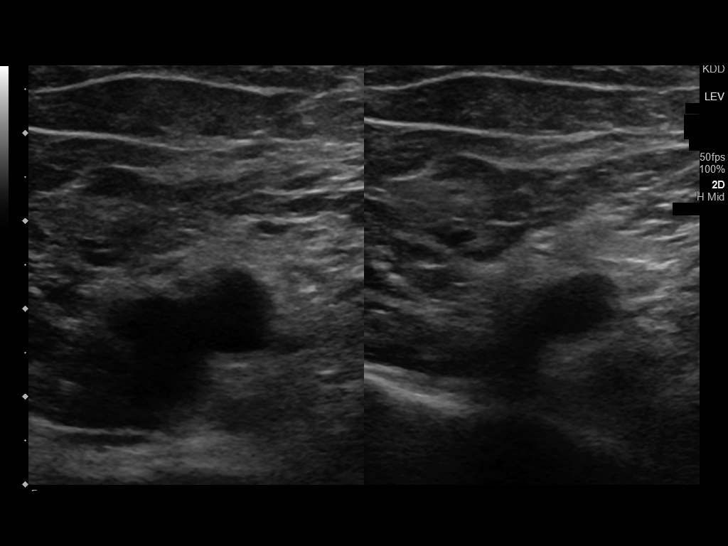
[im 7/37]
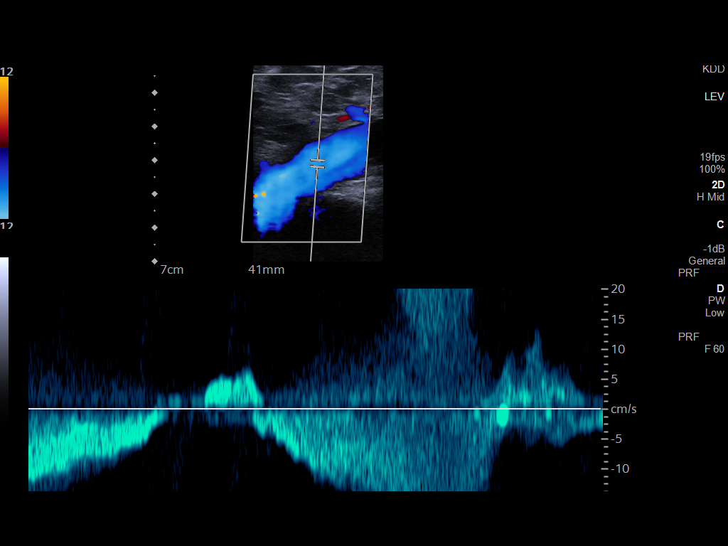
[im 10/37]
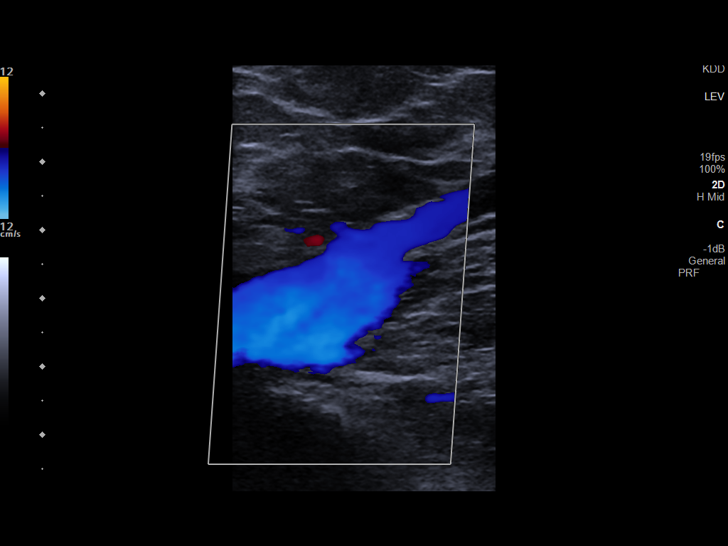
[im 13/37]
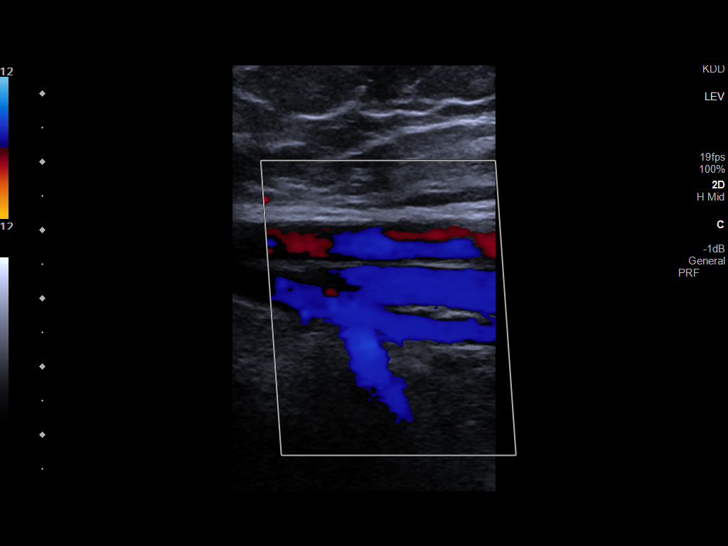
[im 16/37]
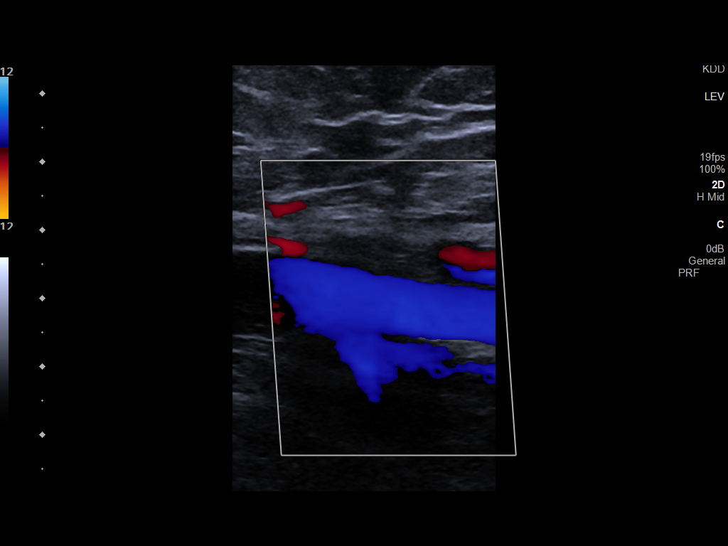
[im 19/37]
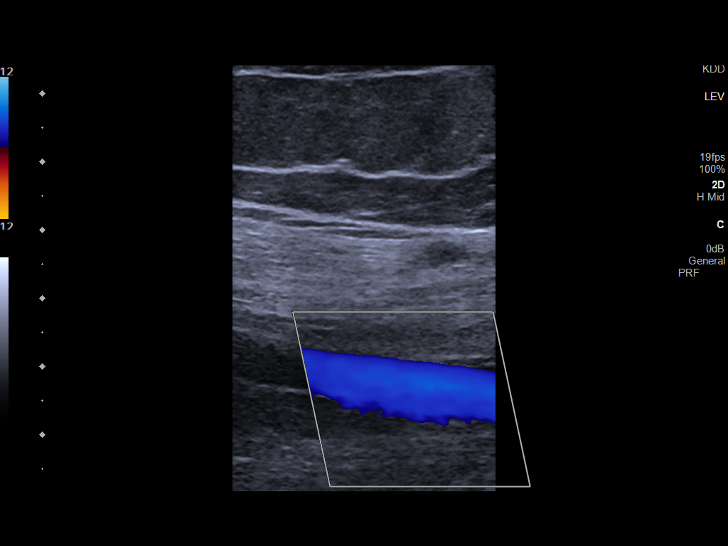
[im 21/37]
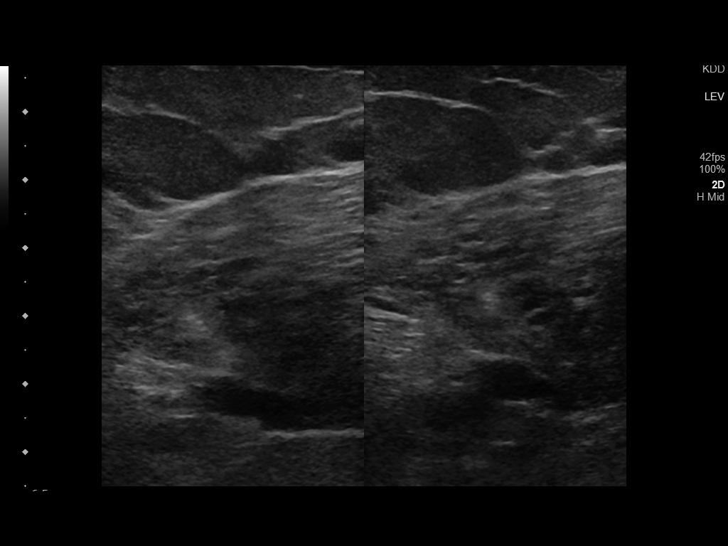
[im 24/37]
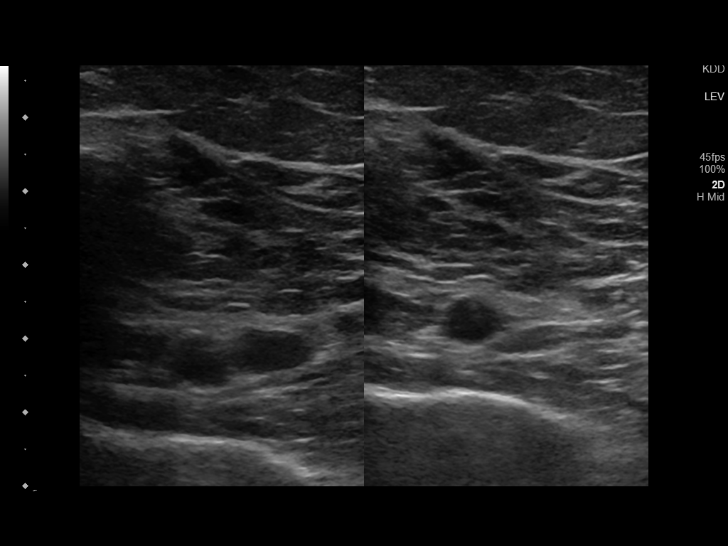
[im 27/37]
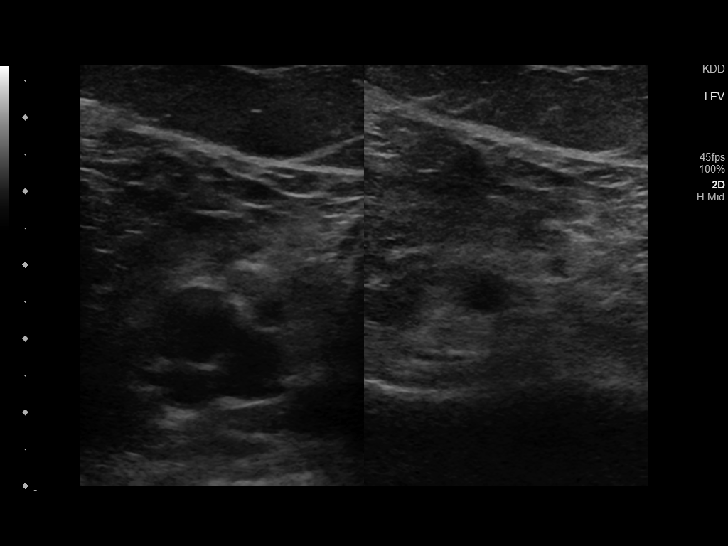
[im 30/37]
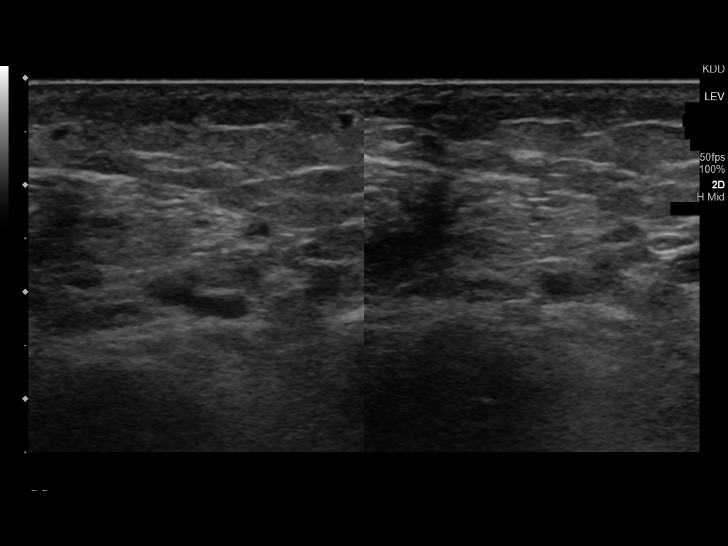
[im 33/37]
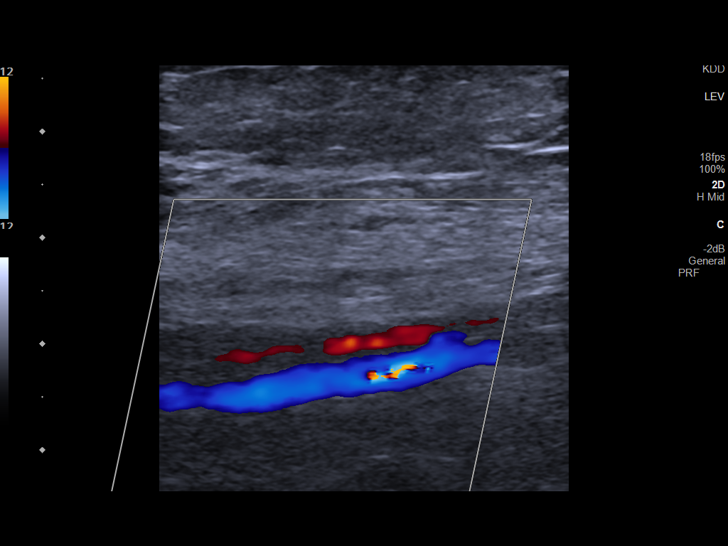
[im 37/37]
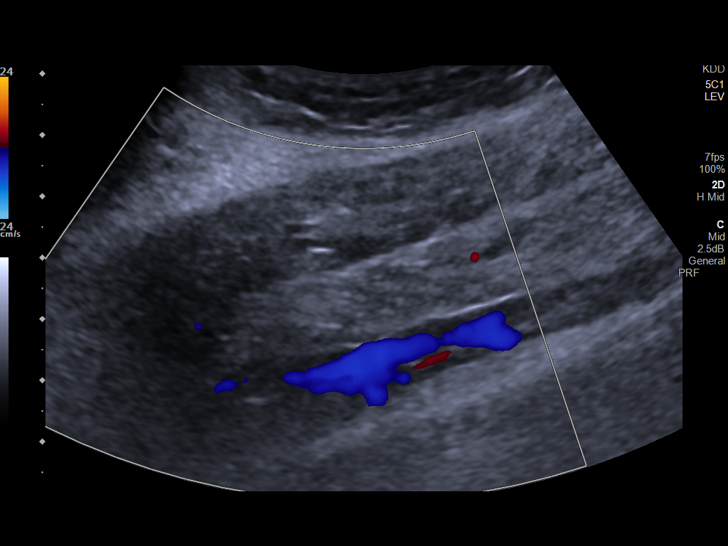

[13 of 24 positions shown; findings below may reference images not displayed]

FINDINGS: Contralateral Common Femoral Vein: Respiratory phasicity is normal
and symmetric with the symptomatic side. No evidence of thrombus.
Normal compressibility.

Common Femoral Vein: No evidence of thrombus. Normal
compressibility, respiratory phasicity and response to augmentation.

Saphenofemoral Junction: No evidence of thrombus. Normal
compressibility and flow on color Doppler imaging.

Profunda Femoral Vein: No evidence of thrombus. Normal
compressibility and flow on color Doppler imaging.

Femoral Vein: No evidence of thrombus. Normal compressibility,
respiratory phasicity and response to augmentation.

Popliteal Vein: No evidence of thrombus. Normal compressibility,
respiratory phasicity and response to augmentation.

Calf Veins: No evidence of thrombus. Normal compressibility and flow
on color Doppler imaging.
IMPRESSION: No evidence of deep venous thrombosis in the left lower extremity.

## 2022-10-07 ENCOUNTER — Ambulatory Visit: Admission: RE | Admit: 2022-10-07 | Payer: 59 | Source: Ambulatory Visit | Admitting: Internal Medicine

## 2022-10-07 ENCOUNTER — Encounter: Admission: RE | Payer: Self-pay | Source: Ambulatory Visit

## 2022-10-07 SURGERY — COLONOSCOPY WITH PROPOFOL
Anesthesia: General

## 2022-11-19 ENCOUNTER — Other Ambulatory Visit: Payer: Self-pay | Admitting: Internal Medicine

## 2022-11-19 DIAGNOSIS — Z1231 Encounter for screening mammogram for malignant neoplasm of breast: Secondary | ICD-10-CM

## 2023-02-12 ENCOUNTER — Ambulatory Visit
Admission: RE | Admit: 2023-02-12 | Discharge: 2023-02-12 | Disposition: A | Discharge status: Home / Self Care | Payer: 59 | Source: Ambulatory Visit | Attending: Internal Medicine | Admitting: Internal Medicine

## 2023-02-12 DIAGNOSIS — Z1231 Encounter for screening mammogram for malignant neoplasm of breast: Secondary | ICD-10-CM | POA: Diagnosis present

## 2023-04-21 ENCOUNTER — Other Ambulatory Visit
Admission: RE | Admit: 2023-04-21 | Discharge: 2023-04-21 | Disposition: A | Discharge status: Home / Self Care | Payer: 59 | Source: Ambulatory Visit | Attending: Physician Assistant | Admitting: Physician Assistant

## 2023-04-21 DIAGNOSIS — R079 Chest pain, unspecified: Secondary | ICD-10-CM | POA: Diagnosis not present

## 2023-04-21 LAB — TROPONIN I (HIGH SENSITIVITY): Troponin I (High Sensitivity): 3 ng/L (ref <18)

## 2024-03-21 ENCOUNTER — Encounter: Payer: Self-pay | Admitting: Internal Medicine

## 2024-04-06 ENCOUNTER — Encounter: Payer: Self-pay | Admitting: Family Medicine

## 2024-04-06 ENCOUNTER — Other Ambulatory Visit: Payer: Self-pay | Admitting: Family Medicine

## 2024-04-06 DIAGNOSIS — N649 Disorder of breast, unspecified: Secondary | ICD-10-CM

## 2024-04-13 ENCOUNTER — Ambulatory Visit
Admission: RE | Admit: 2024-04-13 | Discharge: 2024-04-13 | Disposition: A | Discharge status: Home / Self Care | Source: Ambulatory Visit | Attending: Family Medicine | Admitting: Family Medicine

## 2024-04-13 DIAGNOSIS — N649 Disorder of breast, unspecified: Secondary | ICD-10-CM
